# Patient Record
Sex: Female | Born: 2000 | Hispanic: Yes | Marital: Single | Smoking: Never smoker
Health system: Southern US, Community
[De-identification: ages and names within clinical notes are randomized; demographics above are authoritative.]

## PROBLEM LIST (undated history)

## (undated) DIAGNOSIS — R079 Chest pain, unspecified: Secondary | ICD-10-CM

## (undated) HISTORY — DX: Chest pain, unspecified: R07.9

---
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
## 2020-01-27 ENCOUNTER — Encounter (HOSPITAL_COMMUNITY): Payer: Self-pay

## 2020-01-27 ENCOUNTER — Emergency Department (HOSPITAL_COMMUNITY): Payer: Commercial Managed Care - PPO

## 2020-01-27 ENCOUNTER — Ambulatory Visit (HOSPITAL_COMMUNITY)
Admission: EM | Admit: 2020-01-27 | Discharge: 2020-01-27 | Disposition: A | Discharge status: Home / Self Care | Payer: Self-pay

## 2020-01-27 ENCOUNTER — Emergency Department (HOSPITAL_COMMUNITY)
Admission: EM | Admit: 2020-01-27 | Discharge: 2020-01-27 | Disposition: A | Discharge status: Home / Self Care | Payer: Commercial Managed Care - PPO | Attending: Emergency Medicine | Admitting: Emergency Medicine

## 2020-01-27 ENCOUNTER — Other Ambulatory Visit: Payer: Self-pay

## 2020-01-27 ENCOUNTER — Encounter (HOSPITAL_COMMUNITY): Payer: Self-pay | Admitting: *Deleted

## 2020-01-27 DIAGNOSIS — O2691 Pregnancy related conditions, unspecified, first trimester: Secondary | ICD-10-CM | POA: Diagnosis present

## 2020-01-27 DIAGNOSIS — R072 Precordial pain: Secondary | ICD-10-CM | POA: Insufficient documentation

## 2020-01-27 DIAGNOSIS — R000 Tachycardia, unspecified: Secondary | ICD-10-CM | POA: Insufficient documentation

## 2020-01-27 DIAGNOSIS — Z79899 Other long term (current) drug therapy: Secondary | ICD-10-CM | POA: Diagnosis not present

## 2020-01-27 DIAGNOSIS — Z3A12 12 weeks gestation of pregnancy: Secondary | ICD-10-CM | POA: Insufficient documentation

## 2020-01-27 DIAGNOSIS — R079 Chest pain, unspecified: Secondary | ICD-10-CM

## 2020-01-27 DIAGNOSIS — R0789 Other chest pain: Secondary | ICD-10-CM

## 2020-01-27 DIAGNOSIS — R0602 Shortness of breath: Secondary | ICD-10-CM | POA: Diagnosis not present

## 2020-01-27 LAB — CBC
HCT: 41.5 % (ref 36.0–46.0)
Hemoglobin: 13.7 g/dL (ref 12.0–15.0)
MCH: 30.2 pg (ref 26.0–34.0)
MCHC: 33 g/dL (ref 30.0–36.0)
MCV: 91.4 fL (ref 80.0–100.0)
Platelets: 338 10*3/uL (ref 150–400)
RBC: 4.54 MIL/uL (ref 3.87–5.11)
RDW: 13.8 % (ref 11.5–15.5)
WBC: 9.6 10*3/uL (ref 4.0–10.5)
nRBC: 0 % (ref 0.0–0.2)

## 2020-01-27 LAB — I-STAT BETA HCG BLOOD, ED (MC, WL, AP ONLY): I-stat hCG, quantitative: >2000 m[IU]/mL — ABNORMAL HIGH (ref <5)

## 2020-01-27 LAB — BASIC METABOLIC PANEL
Anion gap: 10 (ref 5–15)
BUN: 5 mg/dL — ABNORMAL LOW (ref 6–20)
CO2: 21 mmol/L — ABNORMAL LOW (ref 22–32)
Calcium: 9.2 mg/dL (ref 8.9–10.3)
Chloride: 102 mmol/L (ref 98–111)
Creatinine, Ser: 0.52 mg/dL (ref 0.44–1.00)
GFR calc Af Amer: >60 mL/min (ref >60)
GFR calc non Af Amer: >60 mL/min (ref >60)
Glucose, Bld: 95 mg/dL (ref 70–99)
Potassium: 3.8 mmol/L (ref 3.5–5.1)
Sodium: 133 mmol/L — ABNORMAL LOW (ref 135–145)

## 2020-01-27 LAB — TROPONIN I (HIGH SENSITIVITY)
Troponin I (High Sensitivity): 3 ng/L (ref <18)
Troponin I (High Sensitivity): 5 ng/L (ref <18)

## 2020-01-27 MED ORDER — SODIUM CHLORIDE 0.9 % IV BOLUS
1000.0000 mL | Freq: Once | INTRAVENOUS | Status: AC
  Administered 2020-01-27: 1000 mL via INTRAVENOUS

## 2020-01-27 MED ORDER — SODIUM CHLORIDE 0.9% FLUSH
3.0000 mL | Freq: Once | INTRAVENOUS | Status: AC
  Administered 2020-01-27: 3 mL via INTRAVENOUS

## 2020-01-27 MED ORDER — IOHEXOL 350 MG/ML SOLN
80.0000 mL | Freq: Once | INTRAVENOUS | Status: AC | PRN
  Administered 2020-01-27: 80 mL via INTRAVENOUS

## 2020-01-27 NOTE — ED Notes (Signed)
Patient is being discharged from the Urgent Care and sent to the Emergency Department via private vehicle/sister . Per Dahlia Byes, NP, patient is in need of higher level of care due to fast heart rate and pregnancy. Patient is aware and verbalizes understanding of plan of care.  Vitals:   01/27/20 1320  BP: 132/85  Pulse: (!) 130  Resp: 18  Temp: 99 F (37.2 C)  SpO2: 99%

## 2020-01-27 NOTE — ED Notes (Signed)
Patient transported to CT 

## 2020-01-27 NOTE — ED Triage Notes (Signed)
Pt got second dose of Covid shot yesterday.  Started getting chill last night.  Pt states would feel uncomfortable laying on left side.  Pt states started having chest tightness when getting dressed.  Pt is [redacted] weeks pregnant.

## 2020-01-27 NOTE — ED Notes (Signed)
Discharge instructions reviewed with pt. Pt verbalized understanding.   

## 2020-01-27 NOTE — ED Provider Notes (Signed)
[facility] Provider Note   CSN: [id number] Arrival date & time: 01/27/20  1414     History Chief Complaint  Patient presents with  . Chest Pain    Robin Mendez is a 19 y.o. female.  The history is provided by the patient.  Chest Pain Pain location:  Substernal area Pain quality: aching   Pain radiates to:  Does not radiate Pain severity:  Mild Onset quality:  Gradual Timing:  Intermittent Progression:  Waxing and waning Chronicity:  New Context: at rest   Relieved by:  Nothing Worsened by:  Nothing Associated symptoms: no abdominal pain, no back pain, no cough, no fever, no palpitations, no shortness of breath and no vomiting   Risk factors: pregnancy ([redacted] weeks pregnant)   Risk factors: no coronary artery disease, no high cholesterol and no hypertension   Risk factors comment:  Recieved second covid shot yesterday      Past Medical History:  Diagnosis Date  . Chest pain     There are no problems to display for this patient.   History reviewed. No pertinent surgical history.   OB History    Gravida  1   Para      Term      Preterm      AB      Living        SAB      TAB      Ectopic      Multiple      Live Births              No family history on file.  Social History   Tobacco Use  . Smoking status: Never Smoker  . Smokeless tobacco: Never Used  Vaping Use  . Vaping Use: Never used  Substance Use Topics  . Alcohol use: Not Currently  . Drug use: Never    Home Medications Prior to Admission medications   Medication Sig Start Date End Date Taking? Authorizing Provider  Prenatal Vit-Fe Fumarate-FA (PRENATAL MULTIVITAMIN) TABS tablet Take 1 tablet by mouth daily at 12 noon.   Yes [provider]    Allergies    Patient has no known allergies.  Review of Systems   Review of Systems  Constitutional: Negative for chills and fever.  HENT: Negative for ear pain and sore  throat.   Eyes: Negative for pain and visual disturbance.  Respiratory: Negative for cough and shortness of breath.   Cardiovascular: Positive for chest pain. Negative for palpitations.  Gastrointestinal: Negative for abdominal pain and vomiting.  Genitourinary: Negative for dysuria and hematuria.  Musculoskeletal: Negative for arthralgias and back pain.  Skin: Negative for color change and rash.  Neurological: Negative for seizures and syncope.  All other systems reviewed and are negative.   Physical Exam Updated Vital Signs  ED Triage Vitals  Enc Vitals Group     BP 01/27/20 1423 (!) 132/98     Pulse Rate 01/27/20 1423 (!) 131     Resp 01/27/20 1423 18     Temp 01/27/20 1423 98.8 F (37.1 C)     Temp Source 01/27/20 1423 Oral     SpO2 01/27/20 1423 100 %     Weight --      Height --      Head Circumference --      Peak Flow --      Pain Score 01/27/20 1431 4     Pain Loc --  Pain Edu? --      Excl. in [place]? --     Physical Exam Vitals and nursing note reviewed.  Constitutional:      General: She is not in acute distress.    Appearance: She is well-developed. She is not ill-appearing.  HENT:     Head: Normocephalic and atraumatic.  Eyes:     Extraocular Movements: Extraocular movements intact.     Conjunctiva/sclera: Conjunctivae normal.     Pupils: Pupils are equal, round, and reactive to light.  Cardiovascular:     Rate and Rhythm: Regular rhythm. Tachycardia present.     Pulses:          Radial pulses are 2+ on the right side and 2+ on the left side.     Heart sounds: Normal heart sounds. No murmur heard.   Pulmonary:     Effort: Pulmonary effort is normal. No respiratory distress.     Breath sounds: Normal breath sounds. No decreased breath sounds, wheezing, rhonchi or rales.  Abdominal:     Palpations: Abdomen is soft.     Tenderness: There is no abdominal tenderness.  Musculoskeletal:        General: Normal range of motion.     Cervical back: Normal  range of motion and neck supple.     Right lower leg: No edema.     Left lower leg: No edema.  Skin:    General: Skin is warm and dry.     Capillary Refill: Capillary refill takes less than 2 seconds.  Neurological:     General: No focal deficit present.     Mental Status: She is alert.     ED Results / Procedures / Treatments   Labs (all labs ordered are listed, but only abnormal results are displayed) Labs Reviewed  BASIC METABOLIC PANEL - Abnormal; Notable for the following components:      Result Value   Sodium 133 (*)    CO2 21 (*)    BUN 5 (*)    All other components within normal limits  I-STAT BETA HCG BLOOD, ED ([facility], [facility], [facility] ONLY) - Abnormal; Notable for the following components:   I-stat hCG, quantitative >2,000.0 (*)    All other components within normal limits  CBC  TROPONIN I (HIGH SENSITIVITY)  TROPONIN I (HIGH SENSITIVITY)    EKG EKG Interpretation  Date/Time:  Wednesday January 27 2020 14:20:29 EDT Ventricular Rate:  134 PR Interval:  114 QRS Duration: 66 QT Interval:  298 QTC Calculation: 445 R Axis:   100 Text Interpretation: Sinus tachycardia Rightward axis Confirmed by Virgina Norfolk [phone]) on 01/27/2020 7:45:59 PM   Radiology CT Angio Chest PE W and/or Wo Contrast  Result Date: 01/27/2020 CLINICAL DATA:  Shortness of breath and chest pain EXAM: CT ANGIOGRAPHY CHEST WITH CONTRAST TECHNIQUE: Multidetector CT imaging of the chest was performed using the standard protocol during bolus administration of intravenous contrast. Multiplanar CT image reconstructions and MIPs were obtained to evaluate the vascular anatomy. CONTRAST:  77mL OMNIPAQUE IOHEXOL 350 MG/ML SOLN COMPARISON:  None. FINDINGS: Cardiovascular: There is slightly suboptimal opacification of the main pulmonary artery. However no central or segmental pulmonary embolism is noted. The heart is normal in size. No pericardial effusion or thickening. No evidence right heart strain. There is normal  three-vessel brachiocephalic anatomy without proximal stenosis. The thoracic aorta is normal in appearance. Mediastinum/Nodes: No hilar, mediastinal, or axillary adenopathy. Thyroid gland, trachea, and esophagus demonstrate no significant findings. Lungs/Pleura: The lungs are clear.  No pleural effusion or pneumothorax. No airspace consolidation. Upper Abdomen: No acute abnormalities present in the visualized portions of the upper abdomen. Musculoskeletal: No chest wall abnormality. No acute or significant osseous findings. Review of the MIP images confirms the above findings. IMPRESSION: No central, segmental, or subsegmental pulmonary embolism. No other acute intrathoracic pathology to explain the patient's symptoms. Electronically Signed   By: Jonna Clark M.D.   On: 01/27/2020 22:13    Procedures Procedures (including critical care time)  Medications Ordered in ED Medications  sodium chloride flush (NS) 0.9 % injection 3 mL (3 mLs Intravenous Given 01/27/20 2029)  sodium chloride 0.9 % bolus 1,000 mL (0 mLs Intravenous Stopped 01/27/20 2223)  iohexol (OMNIPAQUE) 350 MG/ML injection 80 mL (80 mLs Intravenous Contrast Given 01/27/20 2156)    ED Course  I have reviewed the triage vital signs and the nursing notes.  Pertinent labs & imaging results that were available during my care of the patient were reviewed by me and considered in my medical decision making (see chart for details).    MDM Rules/Calculators/A&P                          Maha Fischel is a 19 year old female with no significant medical history presents to the ED with shortness of breath, chest pain, tachycardia.  Patient tachycardic to the 130s but otherwise normal vitals.  Did have second coronavirus vaccine yesterday.  Has had some body aches and chills.  She is [redacted] weeks pregnant.  Denies any abdominal pain, vaginal bleeding, discomfort.  Patient has no GU complaints.  EKG shows sinus tachycardia.  No significant anemia,  electrolyte abnormality, kidney injury.  CT scan did not show any PE or pneumonia.  Troponin negative x2.  Overall suspect symptoms secondary to Covid vaccination.  Does not appear to have any issues from a pregnancy standpoint.  We will continue to follow with OB.  Understands return precautions and discharged from ED in good condition.  At time of discharge tachycardia has improved.  IV fluids have appeared to help.  She has no chest pain no shortness of breath, no abdominal pain, no vaginal bleeding or discharge.  This chart was dictated using voice recognition software.  Despite best efforts to proofread,  errors can occur which can change the documentation meaning.    Final Clinical Impression(s) / ED Diagnoses Final diagnoses:  Atypical chest pain    Rx / DC Orders ED Discharge Orders    None       Virgina Norfolk, DO 01/27/20 2331

## 2020-01-27 NOTE — Discharge Instructions (Addendum)
Please go to the ER There is some concern for heart related issue or possible blood clot.

## 2020-01-27 NOTE — ED Triage Notes (Signed)
Pt presents today with fatigue and low grade fever x2 days after receiving covid vaccine. Pt states she has new onset chest pain as of last night, located under breast/ epigastric that is non radiating. Pt denies n/v/d, HA, cough, runny nose, sore thraot. Pt resting on treatment table, with even unlabored respirations, pink non diaphoretic skin. PT denies otc treatment, or relieving factors. Pt states her HR is usually high, unsure of baseline.

## 2020-01-27 NOTE — ED Provider Notes (Signed)
[facility]    CSN: [id number] Arrival date & time: 01/27/20  1224      History   Chief Complaint Chief Complaint  Patient presents with  . Fatigue    HPI Robin Mendez is a 19 y.o. female.   Patient is a 19 year old female who presents today with fatigue, low-grade fever, chills x2 days.  She did receive her second Covid vaccine yesterday.  New onset chest pain last night under left breast area and epigastric area that is nonradiating.  Patient tachycardic at 130.  Denies any nausea, vomiting, diarrhea, headache, cough,  rhinorrhea, sore throat.  Mild shortness of breath.  Has not taken anything for symptoms.  Denies any history of DVT, PE.  No recent traveling.  She is approximately 3 months pregnant.  ROS per HPI      Past Medical History:  Diagnosis Date  . Chest pain     There are no problems to display for this patient.   History reviewed. No pertinent surgical history.  OB History    Gravida  1   Para      Term      Preterm      AB      Living        SAB      TAB      Ectopic      Multiple      Live Births               Home Medications    Prior to Admission medications   Medication Sig Start Date End Date Taking? Authorizing Provider  Prenatal Vit-Fe Fumarate-FA (PRENATAL MULTIVITAMIN) TABS tablet Take 1 tablet by mouth daily at 12 noon.   Yes [provider]    Family History History reviewed. No pertinent family history.  Social History Social History   Tobacco Use  . Smoking status: Never Smoker  . Smokeless tobacco: Never Used  Vaping Use  . Vaping Use: Never used  Substance Use Topics  . Alcohol use: Not Currently  . Drug use: Never     Allergies   Patient has no known allergies.   Review of Systems Review of Systems   Physical Exam Triage Vital Signs ED Triage Vitals  Enc Vitals Group     BP 01/27/20 1320 132/85     Pulse Rate 01/27/20 1320 (!) 130     Resp 01/27/20 1320 18      Temp 01/27/20 1320 99 F (37.2 C)     Temp Source 01/27/20 1320 Oral     SpO2 01/27/20 1320 99 %     Weight --      Height --      Head Circumference --      Peak Flow --      Pain Score 01/27/20 1321 0     Pain Loc --      Pain Edu? --      Excl. in [place]? --    No data found.  Updated Vital Signs BP 132/85 (BP Location: Right Arm)   Pulse (!) 130   Temp 99 F (37.2 C) (Oral)   Resp 18   LMP 11/12/2019 (Approximate)   SpO2 99%   Visual Acuity Right Eye Distance:   Left Eye Distance:   Bilateral Distance:    Right Eye Near:   Left Eye Near:    Bilateral Near:     Physical Exam Vitals and nursing note reviewed.  Constitutional:  General: She is not in acute distress.    Appearance: Normal appearance. She is not ill-appearing, toxic-appearing or diaphoretic.  HENT:     Head: Normocephalic.     Nose: Nose normal.     Mouth/Throat:     Pharynx: Oropharynx is clear.  Eyes:     Conjunctiva/sclera: Conjunctivae normal.  Cardiovascular:     Rate and Rhythm: Tachycardia present.  Pulmonary:     Effort: Pulmonary effort is normal.  Musculoskeletal:        General: Normal range of motion.     Cervical back: Normal range of motion.  Skin:    General: Skin is warm and dry.     Findings: No rash.  Neurological:     Mental Status: She is alert.  Psychiatric:        Mood and Affect: Mood normal.      UC Treatments / Results  Labs (all labs ordered are listed, but only abnormal results are displayed) Labs Reviewed - No data to display  EKG   Radiology No results found.  Procedures Procedures (including critical care time)  Medications Ordered in UC Medications - No data to display  Initial Impression / Assessment and Plan / UC Course  I have reviewed the triage vital signs and the nursing notes.  Pertinent labs & imaging results that were available during my care of the patient were reviewed by me and considered in my medical decision making (see  chart for details).     Tachycardia Patient with onset tachycardia, chest pain, mild shortness of breath this started last night Patient received her second Covid vaccine yesterday and also is 3 months pregnant. Denies any specific history of DVT or PE Patient tachycardic at 130 today Some of her symptoms may be related to the covid vaccine but there is concern for her elevated HR, chest pain, SOB.  EKG here with sinus tachycardia, possible left atrial enlargement and T wave abnormality consider inferior ischemia Sending to the ER for further evaluation  Final Clinical Impressions(s) / UC Diagnoses   Final diagnoses:  Tachycardia  Chest pain, unspecified type     Discharge Instructions     Please go to the ER There is some concern for heart related issue or possible blood clot.      ED Prescriptions    None     PDMP not reviewed this encounter.   Dahlia Byes A, NP 01/27/20 1407

## 2020-01-27 NOTE — ED Notes (Signed)
Bast NP, aware of HR. See orders for EKG. Bast to assess pt.

## 2020-01-27 NOTE — ED Notes (Signed)
traci bast, np states patient is going with sister

## 2020-02-16 LAB — OB RESULTS CONSOLE GC/CHLAMYDIA
Chlamydia: NEGATIVE
Chlamydia: NEGATIVE
Chlamydia: NEGATIVE
Chlamydia: NEGATIVE
Chlamydia: NEGATIVE
Gonorrhea: NEGATIVE
Gonorrhea: NEGATIVE
Gonorrhea: NEGATIVE
Gonorrhea: NEGATIVE

## 2020-02-16 LAB — OB RESULTS CONSOLE HIV ANTIBODY (ROUTINE TESTING)
HIV: NONREACTIVE
HIV: NONREACTIVE
HIV: NONREACTIVE
HIV: NONREACTIVE

## 2020-02-16 LAB — OB RESULTS CONSOLE HEPATITIS B SURFACE ANTIGEN
Hepatitis B Surface Ag: NEGATIVE
Hepatitis B Surface Ag: NEGATIVE
Hepatitis B Surface Ag: NEGATIVE

## 2020-02-16 LAB — OB RESULTS CONSOLE RUBELLA ANTIBODY, IGM
Rubella: IMMUNE
Rubella: IMMUNE
Rubella: IMMUNE
Rubella: NON-IMMUNE/NOT IMMUNE

## 2020-02-16 LAB — HEPATITIS C ANTIBODY
HCV Ab: NEGATIVE
HCV Ab: NEGATIVE
HCV Ab: NEGATIVE
HCV Ab: NEGATIVE

## 2020-02-16 LAB — OB RESULTS CONSOLE VARICELLA ZOSTER ANTIBODY, IGG
Varicella: NON-IMMUNE/NOT IMMUNE
Varicella: NON-IMMUNE/NOT IMMUNE
Varicella: NON-IMMUNE/NOT IMMUNE

## 2020-02-16 LAB — OB RESULTS CONSOLE GBS: GBS: POSITIVE

## 2020-08-13 NOTE — L&D Delivery Note (Signed)
OB/GYN Faculty Practice Delivery Note  [name] is a 20 y.o. G1P0  at Unknown, reports 37w. She was admitted for SOL.   ROM: 0h 66m with clear fluid GBS Status: positive- inadequate treatment Maximum Maternal Temperature: 101.2*F  Labor Progress: . Patient presented from ED complete, SROM while pushing, pushed for approximately one hour without augmentation to precipitous delivery without complications  Delivery Date/Time: 08/16/2020  Delivery: Called to room and patient was complete and pushing. Head delivered ROA. No nuchal cord present. Shoulder and body delivered in usual fashion. Infant with spontaneous cry, placed on mother's abdomen, dried and stimulated. Cord clamped x 2 after 1-minute delay, and cut by FOB. Cord blood drawn. Placenta delivered spontaneously with gentle cord traction. Fundus firm with massage and Pitocin. Labia, perineum, vagina, and cervix inspected inspected with no tears.   Placenta: Spontaneous, intact, 3VC Complications: none Lacerations: none EBL: Analgesia: none  Postpartum Planning [ ]  message to sent to schedule follow-up  [ ]  vaccines UTD  Infant: Girl, to mother-baby  APGARs 9,9  3070g  , MD [facility], PGY-1 08/16/2020, 9:49 AM

## 2020-08-16 ENCOUNTER — Inpatient Hospital Stay (HOSPITAL_COMMUNITY)
Admission: EM | Admit: 2020-08-16 | Discharge: 2020-08-18 | DRG: 806 | Disposition: A | Discharge status: Home / Self Care | Payer: Medicaid Other | Attending: Family Medicine | Admitting: Family Medicine

## 2020-08-16 ENCOUNTER — Encounter (HOSPITAL_COMMUNITY): Payer: Self-pay | Admitting: *Deleted

## 2020-08-16 DIAGNOSIS — O4202 Full-term premature rupture of membranes, onset of labor within 24 hours of rupture: Secondary | ICD-10-CM | POA: Diagnosis not present

## 2020-08-16 DIAGNOSIS — Z20822 Contact with and (suspected) exposure to covid-19: Secondary | ICD-10-CM | POA: Diagnosis present

## 2020-08-16 DIAGNOSIS — Z3A39 39 weeks gestation of pregnancy: Secondary | ICD-10-CM | POA: Diagnosis not present

## 2020-08-16 DIAGNOSIS — O9902 Anemia complicating childbirth: Secondary | ICD-10-CM | POA: Diagnosis present

## 2020-08-16 DIAGNOSIS — O99824 Streptococcus B carrier state complicating childbirth: Secondary | ICD-10-CM | POA: Diagnosis present

## 2020-08-16 DIAGNOSIS — Z3490 Encounter for supervision of normal pregnancy, unspecified, unspecified trimester: Secondary | ICD-10-CM | POA: Diagnosis present

## 2020-08-16 DIAGNOSIS — O9903 Anemia complicating the puerperium: Secondary | ICD-10-CM | POA: Diagnosis not present

## 2020-08-16 DIAGNOSIS — O26893 Other specified pregnancy related conditions, third trimester: Secondary | ICD-10-CM | POA: Diagnosis present

## 2020-08-16 DIAGNOSIS — D649 Anemia, unspecified: Secondary | ICD-10-CM | POA: Diagnosis not present

## 2020-08-16 DIAGNOSIS — Z3A37 37 weeks gestation of pregnancy: Secondary | ICD-10-CM

## 2020-08-16 HISTORY — DX: Encounter for supervision of normal pregnancy, unspecified, unspecified trimester: Z34.90

## 2020-08-16 LAB — CBC
HCT: 39.5 % (ref 36.0–46.0)
Hemoglobin: 12.3 g/dL (ref 12.0–15.0)
MCH: 28.1 pg (ref 26.0–34.0)
MCHC: 31.1 g/dL (ref 30.0–36.0)
MCV: 90.4 fL (ref 80.0–100.0)
Platelets: 386 10*3/uL (ref 150–400)
RBC: 4.37 MIL/uL (ref 3.87–5.11)
RDW: 14.9 % (ref 11.5–15.5)
WBC: 18.7 10*3/uL — ABNORMAL HIGH (ref 4.0–10.5)
nRBC: 0 % (ref 0.0–0.2)

## 2020-08-16 LAB — TYPE AND SCREEN
ABO/RH(D): O POS
Antibody Screen: NEGATIVE

## 2020-08-16 LAB — RESP PANEL BY RT-PCR (FLU A&B, COVID) ARPGX2
Influenza A by PCR: NEGATIVE
Influenza B by PCR: NEGATIVE
SARS Coronavirus 2 by RT PCR: NEGATIVE

## 2020-08-16 MED ORDER — DIBUCAINE (PERIANAL) 1 % EX OINT: 1.0000 "application " | TOPICAL_OINTMENT | CUTANEOUS | Status: DC | PRN

## 2020-08-16 MED ORDER — SODIUM CHLORIDE 0.9 % IV SOLN
2.0000 g | Freq: Once | INTRAVENOUS | Status: AC
  Administered 2020-08-16: 2 g via INTRAVENOUS
  Filled 2020-08-16: qty 2000

## 2020-08-16 MED ORDER — ACETAMINOPHEN 325 MG PO TABS: 650.0000 mg | ORAL_TABLET | ORAL | Status: DC | PRN

## 2020-08-16 MED ORDER — ONDANSETRON HCL 4 MG PO TABS: 4.0000 mg | ORAL_TABLET | ORAL | Status: DC | PRN

## 2020-08-16 MED ORDER — BENZOCAINE-MENTHOL 20-0.5 % EX AERO: 1.0000 "application " | INHALATION_SPRAY | CUTANEOUS | Status: DC | PRN

## 2020-08-16 MED ORDER — SENNOSIDES-DOCUSATE SODIUM 8.6-50 MG PO TABS
2.0000 | ORAL_TABLET | ORAL | Status: DC
  Administered 2020-08-16 – 2020-08-17 (×2): 2 via ORAL
  Filled 2020-08-16 (×2): qty 2

## 2020-08-16 MED ORDER — ONDANSETRON HCL 4 MG/2ML IJ SOLN: 4.0000 mg | Freq: Four times a day (QID) | INTRAMUSCULAR | Status: DC | PRN

## 2020-08-16 MED ORDER — WITCH HAZEL-GLYCERIN EX PADS: 1.0000 "application " | MEDICATED_PAD | CUTANEOUS | Status: DC | PRN

## 2020-08-16 MED ORDER — OXYCODONE-ACETAMINOPHEN 5-325 MG PO TABS: 2.0000 | ORAL_TABLET | ORAL | Status: DC | PRN

## 2020-08-16 MED ORDER — OXYTOCIN-SODIUM CHLORIDE 30-0.9 UT/500ML-% IV SOLN
2.5000 [IU]/h | INTRAVENOUS | Status: DC
  Administered 2020-08-16: 2.5 [IU]/h via INTRAVENOUS
  Filled 2020-08-16: qty 500

## 2020-08-16 MED ORDER — COCONUT OIL OIL: 1.0000 "application " | TOPICAL_OIL | Status: DC | PRN

## 2020-08-16 MED ORDER — DIPHENHYDRAMINE HCL 25 MG PO CAPS
25.0000 mg | ORAL_CAPSULE | Freq: Four times a day (QID) | ORAL | Status: DC | PRN
Start: 2020-08-16 — End: 2020-08-18

## 2020-08-16 MED ORDER — IBUPROFEN 600 MG PO TABS
600.0000 mg | ORAL_TABLET | Freq: Four times a day (QID) | ORAL | Status: DC
  Administered 2020-08-16 – 2020-08-17 (×7): 600 mg via ORAL
  Filled 2020-08-16 (×8): qty 1

## 2020-08-16 MED ORDER — TETANUS-DIPHTH-ACELL PERTUSSIS 5-2.5-18.5 LF-MCG/0.5 IM SUSY: 0.5000 mL | PREFILLED_SYRINGE | Freq: Once | INTRAMUSCULAR | Status: DC

## 2020-08-16 MED ORDER — SOD CITRATE-CITRIC ACID 500-334 MG/5ML PO SOLN: 30.0000 mL | ORAL | Status: DC | PRN

## 2020-08-16 MED ORDER — LACTATED RINGERS IV SOLN: INTRAVENOUS | Status: DC

## 2020-08-16 MED ORDER — PRENATAL MULTIVITAMIN CH
1.0000 | ORAL_TABLET | Freq: Every day | ORAL | Status: DC
  Administered 2020-08-16 – 2020-08-17 (×2): 1 via ORAL
  Filled 2020-08-16 (×2): qty 1

## 2020-08-16 MED ORDER — OXYTOCIN BOLUS FROM INFUSION
333.0000 mL | Freq: Once | INTRAVENOUS | Status: AC
  Administered 2020-08-16: 333 mL via INTRAVENOUS

## 2020-08-16 MED ORDER — LIDOCAINE HCL (PF) 1 % IJ SOLN: 30.0000 mL | INTRAMUSCULAR | Status: DC | PRN

## 2020-08-16 MED ORDER — ZOLPIDEM TARTRATE 5 MG PO TABS: 5.0000 mg | ORAL_TABLET | Freq: Every evening | ORAL | Status: DC | PRN

## 2020-08-16 MED ORDER — GENTAMICIN SULFATE 40 MG/ML IJ SOLN
400.0000 mg | INTRAVENOUS | Status: AC
  Administered 2020-08-16: 400 mg via INTRAVENOUS
  Filled 2020-08-16: qty 10

## 2020-08-16 MED ORDER — SIMETHICONE 80 MG PO CHEW: 80.0000 mg | CHEWABLE_TABLET | ORAL | Status: DC | PRN

## 2020-08-16 MED ORDER — OXYCODONE-ACETAMINOPHEN 5-325 MG PO TABS: 1.0000 | ORAL_TABLET | ORAL | Status: DC | PRN

## 2020-08-16 MED ORDER — LACTATED RINGERS IV SOLN: 500.0000 mL | INTRAVENOUS | Status: DC | PRN

## 2020-08-16 MED ORDER — ONDANSETRON HCL 4 MG/2ML IJ SOLN: 4.0000 mg | INTRAMUSCULAR | Status: DC | PRN

## 2020-08-16 NOTE — ED Triage Notes (Signed)
Emergency Medicine Provider OB Triage Evaluation Note  Robin Mendez is a 20 y.o. female, G1P0, at Unknown gestation who presents to the emergency department with complaints of abdominal and pelvic pain that began this morning. She is at [redacted] weeks gestation.  She feels like she needs to push. Believes ?her water broke last night.  Pregnancy complicated by group B strep.  Review of  Systems  Positive: Abdominal and pelvic pain Negative: Vaginal bleeding  Physical Exam  BP 132/78   Pulse (!) 132   Temp (!) 101.2 F (38.4 C) (Oral)   Resp (!) 24   LMP 11/12/2019 (Approximate)   SpO2 93%  General: Awake, no distress  HEENT: Atraumatic  Resp: Normal effort  Cardiac: Normal rate Abd: Gravid uterus MSK: Moves all extremities without difficulty Neuro: Speech clear  Medical Decision Making  Pt evaluated for pregnancy concern and is stable for transfer to MAU. Pt is in agreement with plan for transfer.  7:59 AM triage nurse and I spoke to MAU.  There was some concern that we would need to call rapid OB prior to transport.  However due to acuity of condition and need for specialty service for possible delivery, she was transferred to MAU for further evaluation.  I was unaware that she was febrile upon arrival here.  Clinical Impression  No diagnosis found.     Dietrich Pates, PA-C 08/16/20 [phone]

## 2020-08-16 NOTE — H&P (Signed)
Robin Mendez is a 20 y.o. female presenting for SOL. Patient started having contractions last night, progressed and now about every 3 minutes. Went to ED and transferred to MAU. Found to be complete, but membranes intact - patient moved to labor floor. Uncomplicated pregnancy. Went to [facility] for care.  Noted to have fever in ED - no cough, COVID or sick contacts.  OB History    Gravida  1   Para      Term      Preterm      AB      Living        SAB      IAB      Ectopic      Multiple      Live Births             Past Medical History:  Diagnosis Date  . Chest pain    No past surgical history on file. Family History: family history is not on file. Social History:  reports that she has never smoked. She has never used smokeless tobacco. She reports previous alcohol use. She reports that she does not use drugs.     Maternal Diabetes: No Genetic Screening: Normal Maternal Ultrasounds/Referrals: Normal Fetal Ultrasounds or other Referrals:  None Maternal Substance Abuse:  No Significant Maternal Medications:  None Significant Maternal Lab Results:  Group B Strep positive Other Comments:  None  Review of Systems  All other systems reviewed and are negative.  History Dilation: 10 Effacement (%): 100 Exam by:: Dr. Adrian Blackwater Blood pressure 132/78, pulse (!) 132, temperature 99.9 F (37.7 C), temperature source Axillary, resp. rate (!) 24, weight 86.2 kg, last menstrual period 11/12/2019, SpO2 93 %. Maternal Exam:  Uterine Assessment: Contraction strength is moderate.  Contraction duration is 60 seconds. Contraction frequency is regular.   Abdomen: Patient reports no abdominal tenderness. Surgical scars: low transverse.   Fundal height is term.   Fetal presentation: vertex  Introitus: Normal vulva. Normal vagina.  Ferning test: not done.  Nitrazine test: not done. Amniotic fluid character: not assessed.     Fetal Exam Fetal Monitor Review: Baseline rate:  150s.  Variability: moderate (6-25 bpm).   Pattern: no accelerations and no decelerations.    Fetal State Assessment: Category I - tracings are normal.     Physical Exam Vitals reviewed. Exam conducted with a chaperone present.  Constitutional:      Appearance: Normal appearance.  Cardiovascular:     Rate and Rhythm: Regular rhythm. Tachycardia present.     Pulses: Normal pulses.  Pulmonary:     Effort: Pulmonary effort is normal.  Abdominal:     General: Abdomen is flat.  Genitourinary:    General: Normal vulva.  Skin:    General: Skin is warm and dry.     Capillary Refill: Capillary refill takes less than 2 seconds.  Neurological:     General: No focal deficit present.     Mental Status: She is alert.  Psychiatric:        Mood and Affect: Mood normal.        Behavior: Behavior normal.        Thought Content: Thought content normal.        Judgment: Judgment normal.     Prenatal labs: ABO, Rh:   Antibody:   Rubella:   RPR:    HBsAg:    HIV:    GBS:     Assessment/Plan: Ampicillin and gentamycin for intrapartum fever COVID swab  CBC Expect vaginal delivery Breast feeding    Rhona Raider [facility] 08/16/2020, 8:44 AM

## 2020-08-16 NOTE — Lactation Note (Signed)
This note was copied from a baby's chart. Lactation Consultation Note  Patient Name: Girl Jaymes Revels KGYJE'H Date: 08/16/2020 Reason for consult: L&D Initial assessment Age:20 years   P1, infant lying STS with infant between mothers chest. Mother reports that she was planning to breastfeed and would like to have assistance with latching infant.  Infant placed in cradle hold . Assist mother with hand expressing. Mother observed that she has colostrum.  Infant attempt several times to latch and unable to sustain a latch.  Infant placed in football hold . Infant latched on for 15 mins . Observed good rhythmic suckling. Mother happy about infants feed.  Teaching about cues and informed mother that she would be seen again when she gets to her room later and more teaching would be done.     Maternal Data    Feeding Feeding Type: Breast Fed  LATCH Score Latch: Grasps breast easily, tongue down, lips flanged, rhythmical sucking.  Audible Swallowing: Spontaneous and intermittent  Type of Nipple: Everted at rest and after stimulation  Comfort (Breast/Nipple): Soft / non-tender  Hold (Positioning): Assistance needed to correctly position infant at breast and maintain latch.  LATCH Score: 9  Interventions Interventions: Assisted with latch;Skin to skin;Hand express;Adjust position  Lactation Tools Discussed/Used     Consult Status Consult Status: Follow-up Date: 08/16/20 Follow-up type: In-patient    Stevan Born [facility] 08/16/2020, 12:27 PM

## 2020-08-16 NOTE — Lactation Note (Signed)
This note was copied from a baby's chart. Lactation Consultation Note  Patient Name: Robin Mendez APOLI'D Date: 08/16/2020   Age:21 hours  Mom seen in labor and delivery.  However no consult to be seen on floor.  Contacted RN, Sammuel Hines who reports she will put a lactation consult in  [facility] 08/16/2020, 4:34 PM

## 2020-08-16 NOTE — MAU Note (Addendum)
Pt arrived to unit with a complaint of contractions and an urge to push. External fetal monitor applied at 0805, FHR 158. Pt reports spotting, no leaking of fluid, and +FM. At 0808, cervical exam performed by Dr. Adrian Blackwater, pt is complete with a bulging bag of water. Pt transported to L&D with MD and nurses present.

## 2020-08-16 NOTE — Progress Notes (Signed)
ANTIBIOTIC CONSULT NOTE - INITIAL  Pharmacy Consult for Gentamicin Indication: Chorioamnionitis   No Known Allergies  Patient Measurements: Height: 5\' 4"  (162.6 cm) Weight: 86.2 kg (190 lb 0.6 oz) IBW/kg (Calculated) : 54.7 kg Adjusted Body Weight: 67.3 kg  Vital Signs: Temp: 99.9 F (37.7 C) (01/04 0823) Temp Source: Axillary (01/04 0823) BP: 145/88 (01/04 0900) Pulse Rate: 113 (01/04 0900)    Microbiology: No results found for this or any previous visit (from the past 720 hour(s)).  Medications:  Ampicillin 2 grams IV x1  Assessment: 20 y.o. female G1P0 in labor - developed fever   Plan:  Gentamicin 5 mg/kg IV x 1  Check Scr with next labs if gentamicin continued.   26 08/16/2020,9:15 AM

## 2020-08-17 DIAGNOSIS — O9903 Anemia complicating the puerperium: Secondary | ICD-10-CM

## 2020-08-17 DIAGNOSIS — D649 Anemia, unspecified: Secondary | ICD-10-CM

## 2020-08-17 LAB — CBC
HCT: 29.9 % — ABNORMAL LOW (ref 36.0–46.0)
Hemoglobin: 9.6 g/dL — ABNORMAL LOW (ref 12.0–15.0)
MCH: 28.3 pg (ref 26.0–34.0)
MCHC: 32.1 g/dL (ref 30.0–36.0)
MCV: 88.2 fL (ref 80.0–100.0)
Platelets: 354 10*3/uL (ref 150–400)
RBC: 3.39 MIL/uL — ABNORMAL LOW (ref 3.87–5.11)
RDW: 14.8 % (ref 11.5–15.5)
WBC: 27.1 10*3/uL — ABNORMAL HIGH (ref 4.0–10.5)
nRBC: 0 % (ref 0.0–0.2)

## 2020-08-17 LAB — RPR: RPR Ser Ql: NONREACTIVE

## 2020-08-17 MED ORDER — FERROUS SULFATE 325 (65 FE) MG PO TABS: 325.0000 mg | ORAL_TABLET | ORAL | Status: DC

## 2020-08-17 NOTE — Progress Notes (Addendum)
Post Partum Day 1  Subjective: Ms. Nevins reports no pain, however she has mild discomfort/heaviness in her pelvic region. She reports being up ad lib and denies any leg pain or swelling. She has been voiding urine regularly but has been unable to have a BM or flatus since delivery. Moderate bleeding; no clots in the past 22 hours. She is tolerating food PO without nausea or vomiting and denies abdominal pain. She reports that she is not interested in beginning any postpartum contraceptive.   Objective: Blood pressure 109/69, pulse 93, temperature 98.6 F (37 C), temperature source Oral, resp. rate 18, height 5\' 4"  (1.626 m), weight 86.2 kg, last menstrual period 11/12/2019, SpO2 95 %, unknown if currently breastfeeding.  Physical Exam:  General: alert and no distress  Heart: regular rate and rhythm  Lungs: clear to ausculation bilaterally  GI: nontender to palpation, normoactive bowel sounds  Lochia: appropriate  Uterine Fundus: firm DVT Evaluation: No evidence of DVT seen on physical exam. Extremities: pedal pulse 2+ bilaterally, no significant calf/ankle edema   Recent Labs    08/16/20 0937  HGB 12.3  HCT 39.5    Assessment/Plan: Status post spontaneous vaginal delivery. PPD#1 Continue management for lactation consult, monitor vaginal bleeding, educate on contraception, and evaluate for discharge.    LOS: 1 day   10/14/20 08/17/2020, 7:54 AM   Attestation of Supervision of Student:  I confirm that I have verified the information documented in the medical student's note and that I have also personally reperformed the history, physical exam and all medical decision making activities.  I have verified that all services and findings are accurately documented in this student's note; and I agree with management and plan as outlined in the documentation. I have also made any necessary editorial changes.  10/15/2020, MD Center for [facility], [facility] 08/17/2020 9:43 AM

## 2020-08-17 NOTE — Discharge Summary (Signed)
Postpartum Discharge Summary    Patient Name: Robin Mendez DOB: 16-May-2001 MRN: [id number]  Date of admission: 08/16/2020 Delivery date:08/16/2020  Delivering provider: Truett Mainland  Date of discharge: 08/18/2020  Admitting diagnosis: Encounter for induction of labor [Z34.90] Intrauterine pregnancy: [redacted]w[redacted]d    Secondary diagnosis:  Principal Problem:   Vaginal delivery Active Problems:   Encounter for induction of labor  Additional problems: as noted above   Discharge diagnosis: Vaginal delivery                                   Post partum procedures:none Augmentation: none Complications: None  Hospital course: Onset of Labor With Vaginal Delivery      20y.o. yo G1P1001 at 365w5das admitted in Active Labor on 08/16/2020. Patient had an uncomplicated labor course as follows:  Membrane Rupture Time/Date: 8:27 AM ,08/16/2020   Delivery Method:Vaginal, Spontaneous  Episiotomy: None  Lacerations:  None  Patient had an uncomplicated postpartum course.  She is ambulating, tolerating a regular diet, passing flatus, and urinating well. Patient is discharged home in stable condition on 08/18/20.  Newborn Data: Birth date:08/16/2020  Birth time:9:16 AM  Gender:Female  Living status:Living  Apgars:9 ,9  Weight:3070 g   Magnesium Sulfate received: No BMZ received: No Rhophylac:N/A MMR:N/A T-DaP: offered prior to discharge Flu: offered prior to discharge Transfusion:No  Physical exam  Vitals:   08/17/20 0605 08/17/20 1239 08/17/20 2323 08/18/20 0540  BP: 109/69 114/78 116/77 120/77  Pulse: 93 93 88 79  Resp: _0 Temp:  98 F (36.7 C) 97.9 F (36.6 C) 98 F (36.7 C)  TempSrc:  Oral Oral Oral  SpO2:   100% 100%  Weight:      Height:       General: alert, cooperative and no distress Lochia: appropriate Uterine Fundus: firm Incision: N/A DVT Evaluation: No evidence of DVT seen on physical exam. No cords or calf tenderness. No significant calf/ankle  edema. Labs: Lab Results  Component Value Date   WBC 27.1 (H) 08/17/2020   HGB 9.6 (L) 08/17/2020   HCT 29.9 (L) 08/17/2020   MCV 88.2 08/17/2020   PLT 354 08/17/2020   CMP Latest Ref Rng & Units 01/27/2020  Glucose 70 - 99 mg/dL 95  BUN 6 - 20 mg/dL 5(L)  Creatinine 0.44 - 1.00 mg/dL 0.52  Sodium 135 - 145 mmol/L 133(L)  Potassium 3.5 - 5.1 mmol/L 3.8  Chloride 98 - 111 mmol/L 102  CO2 22 - 32 mmol/L 21(L)  Calcium 8.9 - 10.3 mg/dL 9.2   Edinburgh Score: Edinburgh Postnatal Depression Scale Screening Tool 08/17/2020  I have been able to laugh and see the funny side of things. 0  I have looked forward with enjoyment to things. 0  I have blamed myself unnecessarily when things went wrong. 1  I have been anxious or worried for no good reason. 0  I have felt scared or panicky for no good reason. 0  Things have been getting on top of me. 1  I have been so unhappy that I have had difficulty sleeping. 0  I have felt sad or miserable. 0  I have been so unhappy that I have been crying. 0  The thought of harming myself has occurred to me. 0  Edinburgh Postnatal Depression Scale Total 2     After visit meds:  Allergies as of 08/18/2020   No  Known Allergies     Medication List    TAKE these medications   acetaminophen 325 MG tablet Commonly known as: Tylenol Take 2 tablets (650 mg total) by mouth every 6 (six) hours as needed for mild pain, moderate pain, fever or headache.   coconut oil Oil Apply 1 application topically as needed (nipple pain).   ferrous sulfate 325 (65 FE) MG tablet Take 1 tablet (325 mg total) by mouth every other day.   ibuprofen 600 MG tablet Commonly known as: ADVIL Take 1 tablet (600 mg total) by mouth every 8 (eight) hours as needed for moderate pain or cramping.   prenatal multivitamin Tabs tablet Take 1 tablet by mouth daily at 12 noon.       Discharge home in stable condition Infant Feeding: Breast Infant Disposition:home with  mother Discharge instruction: per After Visit Summary and Postpartum booklet. Activity: Advance as tolerated. Pelvic rest for 6 weeks.  Diet: routine diet Future Appointments:No future appointments. Follow up Visit: Pt instructed to call [facility] to schedule postpartum appointment.  Please schedule this patient for a In person postpartum visit in 6 weeks with the following provider: Any provider. Additional Postpartum F/U:none  Low risk pregnancy complicated by: anemia (discharged on po iron) Delivery mode:  Vaginal, Spontaneous  Anticipated Birth Control:  Vanessa Viburnum, Gildardo Cranker, MD OB Fellow, Faculty Practice 08/18/2020 7:11 AM

## 2020-08-17 NOTE — Discharge Instructions (Signed)

## 2020-08-17 NOTE — Lactation Note (Signed)
This note was copied from a baby's chart. Lactation Consultation Note  Patient Name: Robin Mendez [id number] Date: 08/17/2020 Reason for consult: Follow-up assessment Age:21 hours   P1 mother whose infant is now 60 hours old.  This is a term baby at 39+5 weeks.  Mother had just finished breast feeding when I arrived; father attempting to burp baby.  She was asleep on his shoulder.  Mother had asked the RN for some formula "just in case" she needed it.  Reiterated the importance of putting baby to the breast any time she shows feeding cues.  Discussed the benefits of breast milk and mother verbalized understanding.  She really wants to exclusively breast feed but she wants to be sure her baby is "getting enough."  Baby has adequate voids/stools and is content after this last 30 minute feeding.  Suggested mother call for latch assistance if needed.  She will continue to feed 8-12 times/24 hours or sooner if baby shows feeding cues.  She is familiar with hand expression.  Mother does not have a DEBP for home use.  She is a "stay at home" mother and plans to apply for [facility].  I suggested she call the [facility] office today to make an appointment to apply.  Mother verbalized understanding.    Father inquired about the bilirubin level that was drawn this morning.  I provided the results and, again, reminded mother to continue to breast feed often.  Family with no further questions/concerns at this time.  RN updated.   Maternal Data    Feeding Feeding Type: Breast Fed  LATCH Score Latch: Grasps breast easily, tongue down, lips flanged, rhythmical sucking. (per mother)  Audible Swallowing: A few with stimulation  Type of Nipple: Everted at rest and after stimulation  Comfort (Breast/Nipple): Soft / non-tender  Hold (Positioning): No assistance needed to correctly position infant at breast.  LATCH Score: 9  Interventions    Lactation Tools Discussed/Used     Consult Status Consult  Status: Follow-up Date: 08/18/20 Follow-up type: In-patient    Isatu Macinnes R Rita Prom 08/17/2020, 1:17 PM

## 2020-08-18 LAB — SURGICAL PATHOLOGY

## 2020-08-18 MED ORDER — ACETAMINOPHEN 325 MG PO TABS: 650.0000 mg | ORAL_TABLET | Freq: Four times a day (QID) | ORAL | Status: DC | PRN

## 2020-08-18 MED ORDER — COCONUT OIL OIL: 1.0000 "application " | TOPICAL_OIL | 0 refills | Status: DC | PRN

## 2020-08-18 MED ORDER — IBUPROFEN 600 MG PO TABS: 600.0000 mg | ORAL_TABLET | Freq: Three times a day (TID) | ORAL | 0 refills | Status: DC | PRN

## 2020-08-18 MED ORDER — FERROUS SULFATE 325 (65 FE) MG PO TABS: 325.0000 mg | ORAL_TABLET | ORAL | 1 refills | Status: DC

## 2020-08-18 NOTE — Lactation Note (Signed)
This note was copied from a baby's chart. Lactation Consultation Note  Patient Name: Robin Mendez [id number] Date: 08/18/2020 Reason for consult: Mother's request;Primapara Age:21 hours  Mom requested for me to return. I assisted Mom in latching baby. Mom's breasts are filling. Swallows were immediately noted (& verified by cervical auscultation). Mom was comfortable with latch. Frequent swallows were audible to naked ear, which Mom was able to identify.  Infant latched with relative ease. I did assist mother with hand placement, breast compression, maternal alignment, and how, on occasion, she may need to lower infant's chin to widen gape. Specifics of an asymmetric latch shown via [employer]. Infant finished feeding content.   Mom's nipple shape not distorted when infant released her latches. I taught Mom how to palpate her breasts before and after a feeding to determine milk transfer/softening of breast. Both of Mom's breasts felt softer after the feedings, with Mom saying, ""I feel the difference a lot" after infant had fed on one breast for 20 min.  At rest, Mom's nipple diameter shows that she could use a size 21 flange, which I provided. Mom also knows she has a size 24 flange in case she needs more area around nipple. I showed Mom how to disassemble & assemble hand pump. Hand pump use is prn. Mom knows how to reach [place] if she has any post-discharge questions.    Mom was thankful for consult.   Lurline Hare [facility] 08/18/2020, 9:26 AM

## 2020-08-18 NOTE — Lactation Note (Signed)
This note was copied from a baby's chart. Lactation Consultation Note  Patient Name: Robin Mendez HERDE'Y Date: 08/18/2020   Age:21 hours  Mom holding infant, who was sleeping when I went into room. Mom will be calling me to return to assess next latch.  Mom only has a [place] pump at home. I provided Mom with a hand pump. I'll discern proper flange size at next visit.   Mom says her breasts feel a little bit heavier. Nipples intact.  Regular flow nipple noted in room; Dad reports gulping when infant bottle-feeds. I took a Similac slow-flow nipple into room, if later needed.    Lurline Hare [facility] 08/18/2020, 8:54 AM

## 2020-08-20 ENCOUNTER — Ambulatory Visit: Payer: Self-pay

## 2020-08-20 NOTE — Lactation Note (Signed)
This note was copied from a baby's chart. Lactation Consultation Note  Patient Name: Robin Mendez Today's Date: 08/20/2020 Reason for consult: Follow-up assessment;Mother's request;Hyperbilirubinemia;Other (Comment) (Pediatric admission) Age:21 days  Follow up visit to 81 days old infant with hyperbilirrubinemia. Parents are present at time of visit. Father of infant is getting ready to bottle-feed formula. Mother requests assistance with personal pump. Mother explains she has not used hospital DEBP since yesterday. Talked to mother about the importance of pumping to avoid engorgement and to establish a good milk supply. Reinforced the importance of pumping every time infant gets supplementation.   Mother states her personal pump does not seem to be working. Personal breast pump power adaptor is not working. Discussed taking back to purchasing store and/or Administrator, [employer] service.   Mother wants assistance with pumping. Reviewed how to use hospital grade DEBP with maintenance setting. Mother states she pumped ~45 mL of EBM. Showed initial drops, then spraying (letdown), followed by drops again. Pumped for two more minutes when noticing drops after letdown. Discussed that pumping session time will always vary and it will give her stimulation similar to infant feeding from breast. Collected ~104 mL of EBM in 25 minutes. Mother verbalizes in agreement. Reviewed milk storage and care of pump parts. Provided "understanding breastfeeding" booklet for more information.   Encouraged to request [facility] for any needs, support or questions. Praised mother for her milk supply, effort and dedication.    Feeding Feeding Type: Bottle Fed - Formula Nipple Type: Nfant Slow Flow (purple)  Interventions Interventions: Breast feeding basics reviewed;Breast massage;Expressed milk;DEBP  Lactation Tools Discussed/Used Tools: Pump;Flanges;Bottle Flange Size: 24 Breast pump type: Double-Electric  Breast Pump Pump Education: Setup, frequency, and cleaning;Milk Storage   Consult Status Consult Status: Follow-up Date: 08/21/20 Follow-up type: In-patient    Jess Toney A Higuera Ancidey 08/20/2020, 12:36 PM

## 2020-08-20 NOTE — Lactation Note (Addendum)
This note was copied from a baby's chart. Lactation Consultation Note Baby 68 hrs old when saw mom. Baby on DPT.  Mom hadn't pump and breast are engorged. Mom's pump battery dead. RN charging mom's pump. RN brought hospital DEBP to rm. LC brought pump kit. Mom stated she BF baby for 17 minutes.  Breast firm w/knots. Rt. Breast has more knots than Lt. Breast. Used #24 flange. Pumped breast immediately transitional milk flowed well into bottles.  Mom shown how to use DEBP & how to disassemble, clean, & reassemble parts. Mom knows to pump q 2-3h for 15-20 min. As needed for engorgement and supplementing.  LC massaged breast while mom pumped. Mom collected almost 47 ml. Praised mom. Laid mom back. Applied Ice to breast over cloth. Mom very sleepy. Instructed mom to ice for 20 minutes. Encouraged mom to give BM after BF for supplement d/t hyperbilirubinemia.  Pump according needs of breast to manage engorgement. Milk storage reviewed. Reported to RN.  Patient Name: Robin Mendez Today's Date: 08/20/2020 Reason for consult: Initial assessment;Primapara;Hyperbilirubinemia Age:21 days  Maternal Data Has patient been taught Hand Expression?: Yes Does the patient have breastfeeding experience prior to this delivery?: No  Feeding Feeding Type: Formula (Sim Advance) Nipple Type: Nfant Slow Flow (purple) (normal slow flow nipples out of stock)  LATCH Score       Type of Nipple: Everted at rest and after stimulation  Comfort (Breast/Nipple): Engorged, cracked, bleeding, large blisters, severe discomfort        Interventions Interventions: DEBP;Ice;Breast massage;Breast compression  Lactation Tools Discussed/Used Tools: Pump Breast pump type: Double-Electric Breast Pump Pump Education: Setup, frequency, and cleaning;Milk Storage Initiated by:: Allayne Stack RN IBCLC Date initiated:: 08/20/20   Consult Status Consult Status: Follow-up Date: 08/20/20 Follow-up type:  In-patient    Theodoro Kalata 08/20/2020, 3:49 AM

## 2020-08-22 NOTE — MAU Note (Signed)
Evaluated in MAU and brought to L&D. See H&P note.

## 2021-05-01 IMAGING — CT CT ANGIO CHEST
2 of 6 series · 18 of 46 positions shown · IV contrast (omnipaque)
Comparison: None.

CLINICAL DATA: Shortness of breath and chest pain

EXAM:
CT ANGIOGRAPHY CHEST WITH CONTRAST
TECHNIQUE: Multidetector CT imaging of the chest was performed using the
standard protocol during bolus administration of intravenous
contrast. Multiplanar CT image reconstructions and MIPs were
obtained to evaluate the vascular anatomy.
CONTRAST:  80mL OMNIPAQUE IOHEXOL 350 MG/ML SOLN

[Series 6: thins · axial · 0.70mm/px · z∈[+1127,+1349]mm · 15 of 244 slices shown]
[im 11/244  lung]
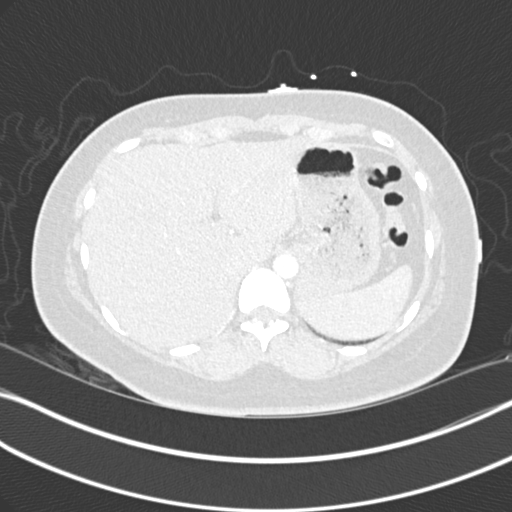
[im 32/244  soft-tissue]
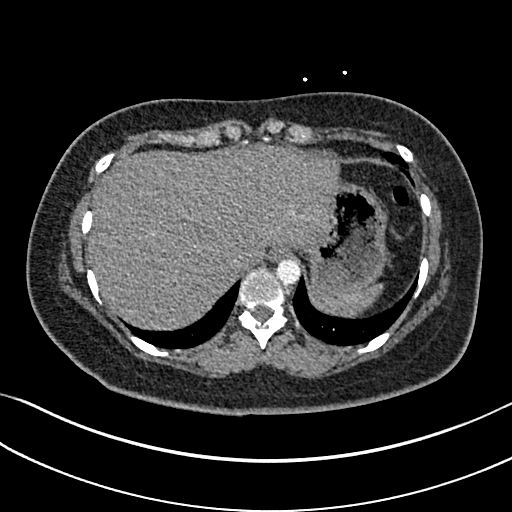
[im 43/244  lung]
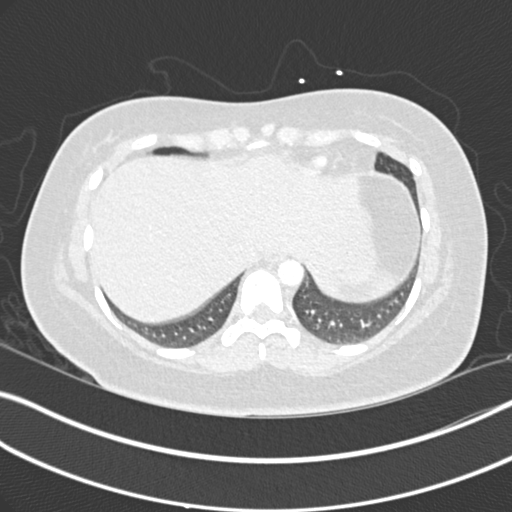
[im 64/244  soft-tissue]
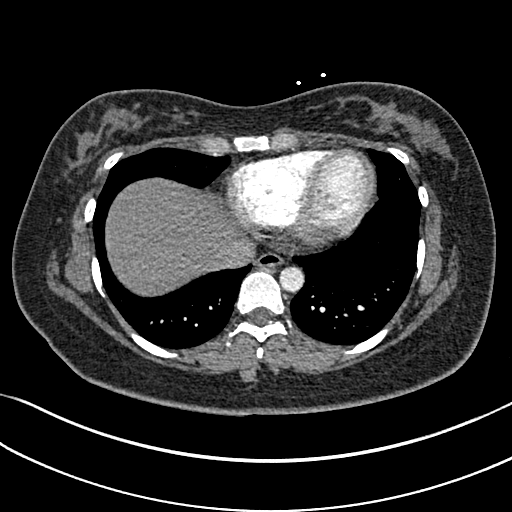
[im 74/244  lung]
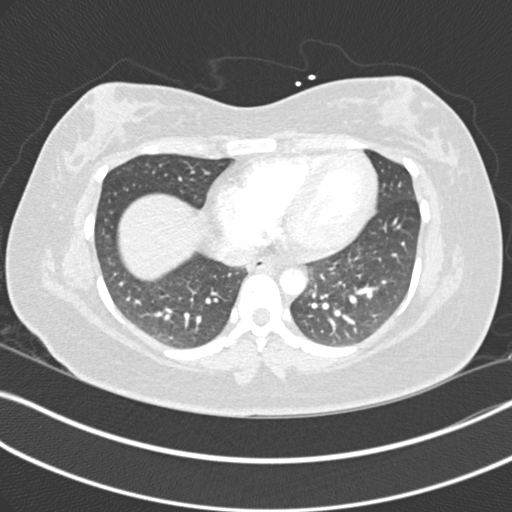
[im 96/244  soft-tissue]
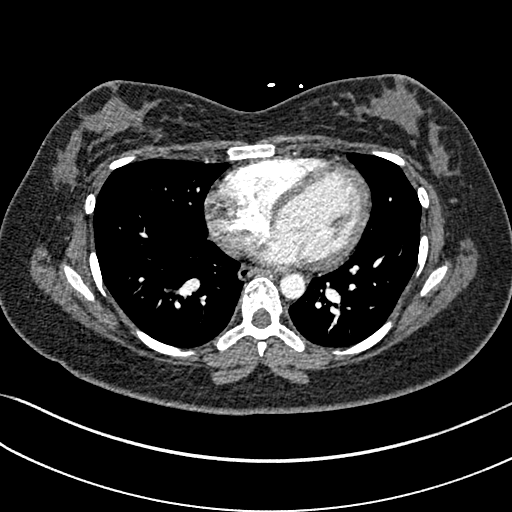
[im 106/244  lung]
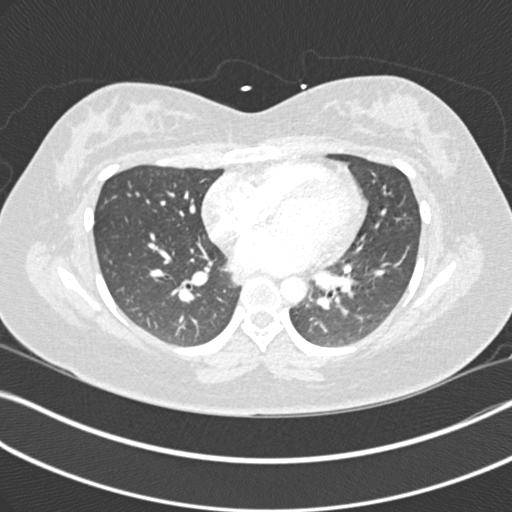
[im 127/244  soft-tissue]
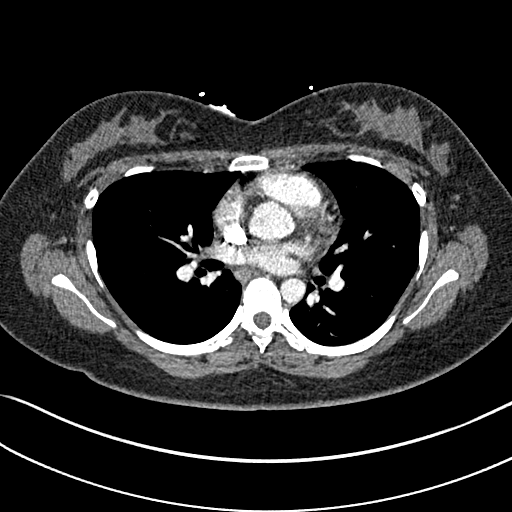
[im 138/244  lung]
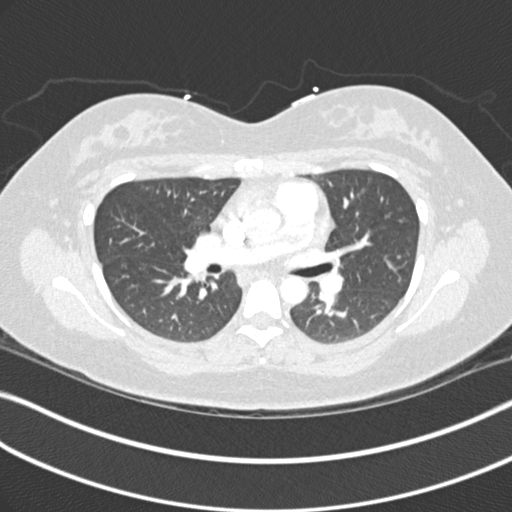
[im 148/244  soft-tissue]
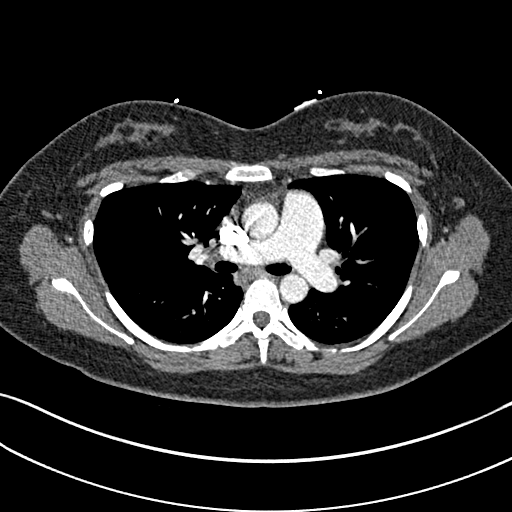
[im 170/244  lung]
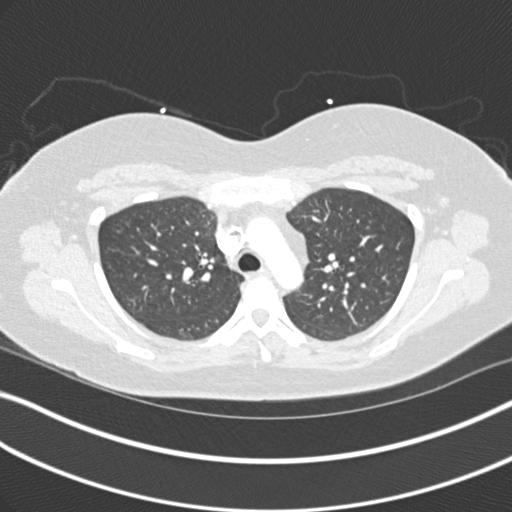
[im 180/244  soft-tissue]
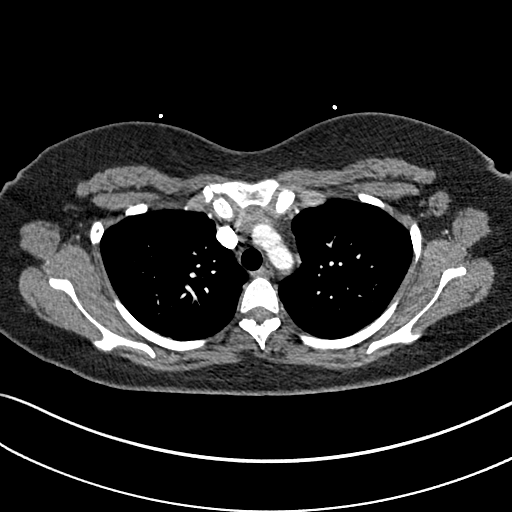
[im 201/244  lung]
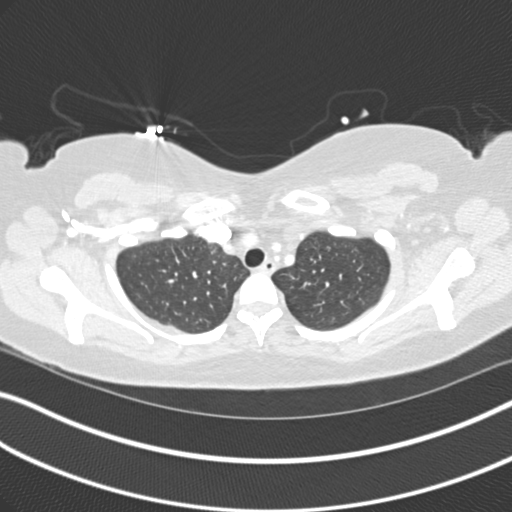
[im 212/244  soft-tissue]
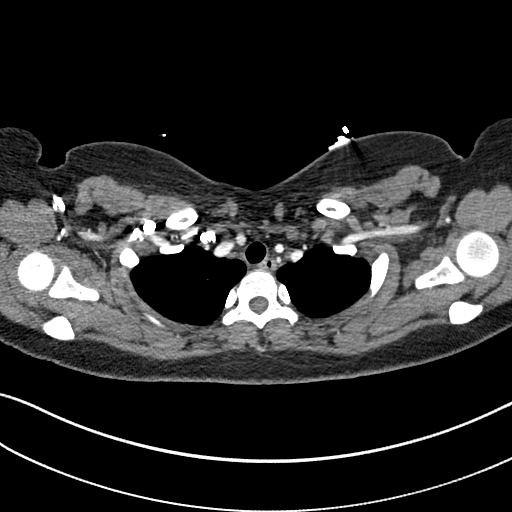
[im 233/244  lung]
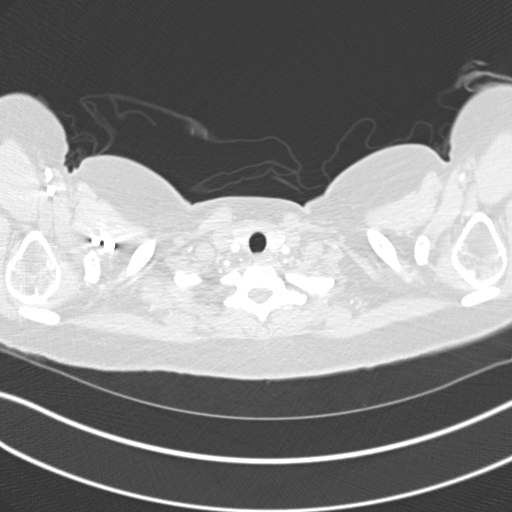

[Series 8: coronal mpr · coronal · 0.47mm/px · 3 of 132 slices shown]
[im 33/132  soft-tissue]
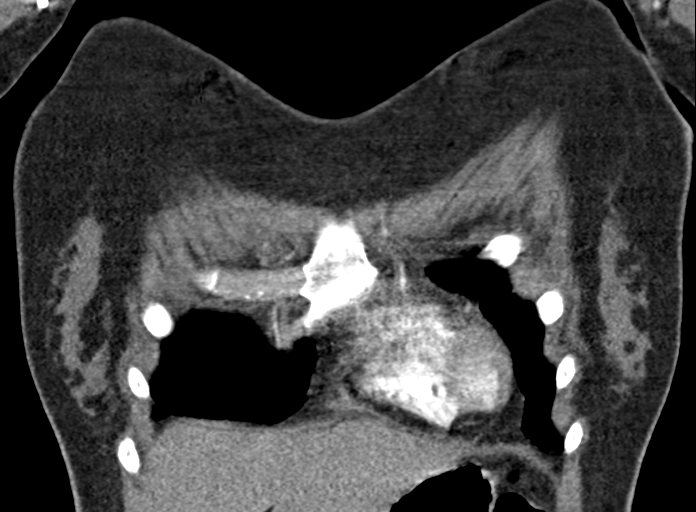
[im 66/132  soft-tissue]
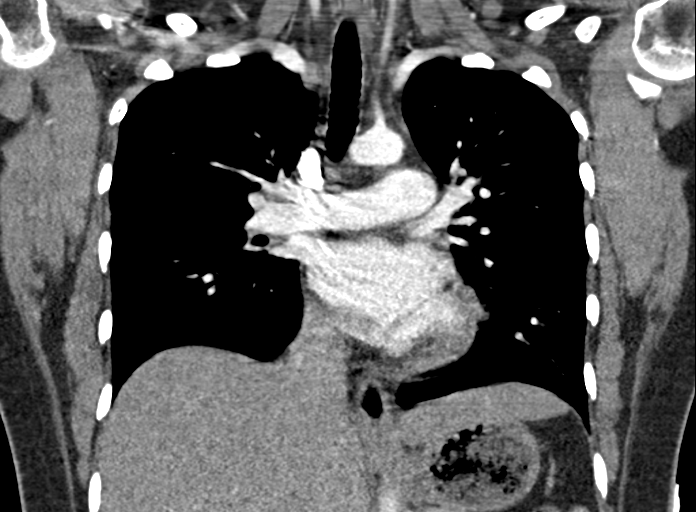
[im 99/132  soft-tissue]
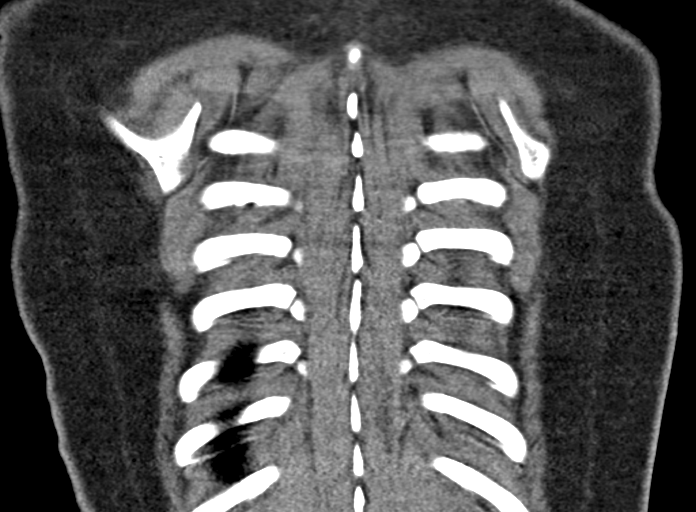

[18 of 46 positions shown; findings below may reference images not displayed]

FINDINGS: Cardiovascular: There is slightly suboptimal opacification of the
main pulmonary artery. However no central or segmental pulmonary
embolism is noted. The heart is normal in size. No pericardial
effusion or thickening. No evidence right heart strain. There is
normal three-vessel brachiocephalic anatomy without proximal
stenosis. The thoracic aorta is normal in appearance.

Mediastinum/Nodes: No hilar, mediastinal, or axillary adenopathy.
Thyroid gland, trachea, and esophagus demonstrate no significant
findings.

Lungs/Pleura: The lungs are clear. No pleural effusion or
pneumothorax. No airspace consolidation.

Upper Abdomen: No acute abnormalities present in the visualized
portions of the upper abdomen.

Musculoskeletal: No chest wall abnormality. No acute or significant
osseous findings.

Review of the MIP images confirms the above findings.
IMPRESSION: No central, segmental, or subsegmental pulmonary embolism.

No other acute intrathoracic pathology to explain the patient's
symptoms.

## 2022-10-15 ENCOUNTER — Ambulatory Visit: Payer: Commercial Managed Care - PPO | Admitting: Dermatology

## 2022-10-29 ENCOUNTER — Ambulatory Visit: Payer: Commercial Managed Care - PPO | Admitting: Dermatology

## 2023-01-16 ENCOUNTER — Ambulatory Visit (INDEPENDENT_AMBULATORY_CARE_PROVIDER_SITE_OTHER): Payer: Self-pay | Admitting: Dermatology

## 2023-01-16 ENCOUNTER — Encounter: Payer: Self-pay | Admitting: Dermatology

## 2023-01-16 VITALS — BP 125/82

## 2023-01-16 DIAGNOSIS — L730 Acne keloid: Secondary | ICD-10-CM

## 2023-01-16 DIAGNOSIS — L700 Acne vulgaris: Secondary | ICD-10-CM

## 2023-01-16 MED ORDER — TRETINOIN 0.05 % EX CREA
TOPICAL_CREAM | Freq: Every evening | CUTANEOUS | 2 refills | Status: DC
Start: 2023-01-16 — End: 2023-04-18

## 2023-01-16 MED ORDER — CLINDAMYCIN PHOSPHATE 1 % EX SWAB: 1.0000 | Freq: Every day | CUTANEOUS | 2 refills | Status: DC

## 2023-01-16 NOTE — Progress Notes (Signed)
   New Patient Visit   Subjective  Robin Mendez is a 22 y.o. 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 She has tried OTC Differin gel, Panoxyl wash without improvement No prior prescriptions.   The following portions of the chart were reviewed this encounter and updated as appropriate: medications, allergies, medical history  Review of Systems:  No other skin or systemic complaints except as noted in HPI or Assessment and Plan.  Objective  Well appearing patient in no apparent distress; mood and affect are within normal limits.  Areas Examined: Face, chest and back  Relevant exam findings are noted in the Assessment and Plan.   Assessment & Plan    ACNE VULGARIS Exam: Open comedones and inflammatory papules on the face  Not at Goal  Treatment Plan: Clindamycin swabs every morning  Tretinoin 0.05% cream 3 night  per week  Hyaluronic acid lotion before application of Tretinoin follow with moisturizer.  Discussed treatment with oral Spironolactone and Doxycyline.  She currently declines oral medications.   Avoid Salicylic Acid and Benzoyl peroxide products    Acne Scarring Exam: ice pick and box scars at the bilateral cheeks.  Counseling: -Explained to patient that acnes scars can improve with topical retinoids  -We will continue to increase strength of topical retinoids over time as tolerated  -Once acne is under control we will discuss laser or microneedling as long term treatment options.    Treatment Plan: Tretinoin 0.05% cream 3 nights per week    Return in about 3 months (around 04/18/2023) for Acne Follow Up.  Jaclynn Guarneri, CMA, am acting as scribe for [employer], DO.   Documentation: I have reviewed the above documentation for accuracy and completeness, and I agree with the above.  Langston Reusing, DO

## 2023-01-16 NOTE — Patient Instructions (Signed)
Avoid Salicylic Acid and Benzoyl peroxide products                                                                Daily Skincare Regimen: Patient Handout  Morning Routine:  Cleanse: Start your day by washing your face with a gentle cleanser. Choose a product suitable for your skin type, such as CeraVe, Neutrogena, Cetaphil, or LaRoche-Posay. Gently massage the cleanser onto damp skin, then rinse thoroughly with lukewarm water and pat dry with a clean towel.  Apply Medication: Apply prescription medication Clindamycin swabs to face   Moisturize: Finish your morning routine by applying a moisturizer to your face and neck. Opt for a moisturizer that has SPF included, is suitable for your skin type and provides hydration without clogging pores. Consider brands like CeraVe, Neutrogena, Cetaphil, or LaRoche-Posay for effective hydration and skin barrier support.  Evening Routine:  Cleanse: Before bed, cleanse your face again with a gentle cleanser to remove makeup, dirt, and impurities accumulated throughout the day. Use the same cleanser as in the morning and follow the same steps for cleansing.  Apply Medication: Ensure that your skin is completely dry before applying any topical treatments to maximize their efficacy.  Apply prescription medication Tretinoin gel on Monday, Wednesday, and Friday.    Moisturize: After applying any medications, moisturize your skin to seal in hydration and support skin barrier function. Choose a moisturizer suitable for nighttime use that helps replenish moisture overnight. Look for products from trusted brands like CeraVe, Neutrogena, Cetaphil, or LaRoche-Posay for optimal results.   Additional Tips:  Sun Protection: During the daytime, it is essential to apply a broad-spectrum sunscreen with an SPF of 30 or higher to protect your skin from harmful UV rays. Apply sunscreen as the last step of your morning skincare routine and reapply throughout the  day as needed, especially if you will be spending time outdoors.  Hydration: Drink plenty of water throughout the day to keep your skin hydrated from within.  Healthy Lifestyle: Maintain a balanced diet, get regular exercise, manage stress, and prioritize adequate sleep to support overall skin health.   Following a consistent daily skincare regimen can help promote healthy, radiant skin and minimize the risk of common skin issues. Be patient and consistent with your routine, and remember to listen to your skin's needs   Due to recent changes in healthcare laws, you may see results of your pathology and/or laboratory studies on MyChart before the doctors have had a chance to review them. We understand that in some cases there may be results that are confusing or concerning to you. Please understand that not all results are received at the same time and often the doctors may need to interpret multiple results in order to provide you with the best plan of care or course of treatment. Therefore, we ask that you please give [place] 2 business days to thoroughly review all your results before contacting the office for clarification. Should we see a critical lab result, you will be contacted sooner.   If You Need Anything After Your Visit  If you have any questions or concerns for your doctor, please call our main line at [phone] If no one answers, please leave a voicemail as  directed and we will return your call as soon as possible. Messages left after 4 pm will be answered the following business day.   You may also send [place] a message via MyChart. We typically respond to MyChart messages within 1-2 business days.  For prescription refills, please ask your pharmacy to contact our office. Our fax number is [phone].  If you have an urgent issue when the clinic is closed that cannot wait until the next business day, you can page your doctor at the number below.    Please note that while we do our  best to be available for urgent issues outside of office hours, we are not available 24/7.   If you have an urgent issue and are unable to reach [place], you may choose to seek medical care at your doctor's office, retail clinic, urgent care center, or emergency room.  If you have a medical emergency, please immediately call 911 or go to the emergency department. In the event of inclement weather, please call our main line at [phone] for an update on the status of any delays or closures.  Dermatology Medication Tips: Please keep the boxes that topical medications come in in order to help keep track of the instructions about where and how to use these. Pharmacies typically print the medication instructions only on the boxes and not directly on the medication tubes.   If your medication is too expensive, please contact our office at [phone] or send [place] a message through MyChart.   We are unable to tell what your co-pay for medications will be in advance as this is different depending on your insurance coverage. However, we may be able to find a substitute medication at lower cost or fill out paperwork to get insurance to cover a needed medication.   If a prior authorization is required to get your medication covered by your insurance company, please allow [place] 1-2 business days to complete this process.  Drug prices often vary depending on where the prescription is filled and some pharmacies may offer cheaper prices.  The website www.goodrx.com contains coupons for medications through different pharmacies. The prices here do not account for what the cost may be with help from insurance (it may be cheaper with your insurance), but the website can give you the price if you did not use any insurance.  - You can print the associated coupon and take it with your prescription to the pharmacy.  - You may also stop by our office during regular business hours and pick up a GoodRx coupon card.  - If you  need your prescription sent electronically to a different pharmacy, notify our office through [facility] or by phone at [phone]

## 2023-04-18 ENCOUNTER — Encounter: Payer: Self-pay | Admitting: Dermatology

## 2023-04-18 ENCOUNTER — Ambulatory Visit (INDEPENDENT_AMBULATORY_CARE_PROVIDER_SITE_OTHER): Payer: Self-pay | Admitting: Dermatology

## 2023-04-18 VITALS — BP 122/83

## 2023-04-18 DIAGNOSIS — L700 Acne vulgaris: Secondary | ICD-10-CM

## 2023-04-18 DIAGNOSIS — L659 Nonscarring hair loss, unspecified: Secondary | ICD-10-CM

## 2023-04-18 MED ORDER — ARAZLO 0.045 % EX LOTN: 1.0000 "application " | TOPICAL_LOTION | CUTANEOUS | 3 refills | Status: DC

## 2023-04-18 MED ORDER — BIMATOPROST 0.03 % EX SOLN: 1.0000 "application " | Freq: Every day | CUTANEOUS | 4 refills | Status: DC

## 2023-04-18 NOTE — Progress Notes (Signed)
   Follow-Up Visit   Subjective  Robin Mendez is a 22 y.o. female who presents for the following: She is for acne follow up today. She is doing much better since her last appointment. She is treating with Clindamycin pads Tuesday, Thursday and on the weekends and Treinoin 0.05% cream Monday, Wednesday and Friday nights.   The following portions of the chart were reviewed this encounter and updated as appropriate: medications, allergies, medical history  Review of Systems:  No other skin or systemic complaints except as noted in HPI or Assessment and Plan.  Objective  Well appearing patient in no apparent distress; mood and affect are within normal limits.    Relevant exam findings are noted in the Assessment and Plan.    Assessment & Plan   ACNE VULGARIS Exam: Open comedones and inflammatory papules  Improving  Treatment Plan:  Discontinue Tretinoin 0.05% cream.  Start Arazlo lotion at bedtime 3 nights per week.  Continue Clindamycin wipes in the morning Tuesday, Thursday and weekends.  Hypotrichosis of eyelashes  Treatment Plan: Start Latisse qhs         Return in about 7 months (around 11/16/2023) for Acne.    Documentation: I have reviewed the above documentation for accuracy and completeness, and I agree with the above.  Langston Reusing, DO

## 2023-04-18 NOTE — Patient Instructions (Addendum)
Hello Robin Mendez,   Thank you for visiting our clinic today. Your dedication to improving your skin health is greatly appreciated. Below is a summary of the key instructions from today's consultation:  - Medication Changes:   - Discontinue Tretinoin 0.05%.   - Start Arazlo (Tazarotene): A stronger retinoid.     - Application: Apply a pea-sized amount to the forehead, cheeks, and chin three nights a week.     - Adjustment: Reduce application to twice a week in very cold weather.     - Combination: Use in conjunction with a moisturizer.     - Precaution: Wait 15-20 minutes before lying down due to stickiness.   - Continue: Using clindamycin pads in the morning.  - Daily Skincare Routine:   - Morning: Wash face, use clindamycin pads, apply moisturizer, and sunscreen.   - Evening: Wash face, apply [place] hyaluronic acid, use Arazlo, followed by moisturizer if needed.  - Prescriptions and Pharmacy:   - Arazlo: Prescription will be processed through a special pharmacy to ensure a reduced cost with your insurance [facility]).   - Latisse for Eyelash Growth:     - Size: Opted for the 3 mL size for cost efficiency.     - Refills: Four refills provided.  - Follow-Up:   - Next Visit: No need for a return visit until April to discuss potential adjustments in the frequency of Arazlo usage.   Please do not hesitate to reach out if you have any further questions or concerns about your treatment plan.  Warm regards,  Dr. Langston Reusing Dermatology     Your prescription was sent to [facility] in [place]. A representative from [facility] will contact you within 2 business hours to verify your address and insurance information to schedule a free delivery. If for any reason you do not receive a phone call from them, please reach out to them. Their phone number is [phone] and their hours are Monday-Friday 9:00 am-5:00 pm.      Important Information  Due to recent  changes in healthcare laws, you may see results of your pathology and/or laboratory studies on MyChart before the doctors have had a chance to review them. We understand that in some cases there may be results that are confusing or concerning to you. Please understand that not all results are received at the same time and often the doctors may need to interpret multiple results in order to provide you with the best plan of care or course of treatment. Therefore, we ask that you please give [place] 2 business days to thoroughly review all your results before contacting the office for clarification. Should we see a critical lab result, you will be contacted sooner.   If You Need Anything After Your Visit  If you have any questions or concerns for your doctor, please call our main line at [phone] If no one answers, please leave a voicemail as directed and we will return your call as soon as possible. Messages left after 4 pm will be answered the following business day.   You may also send [place] a message via MyChart. We typically respond to MyChart messages within 1-2 business days.  For prescription refills, please ask your pharmacy to contact our office. Our fax number is [phone].  If you have an urgent issue when the clinic is closed that cannot wait until the next business day, you can page your doctor at the number below.    Please note  that while we do our best to be available for urgent issues outside of office hours, we are not available 24/7.   If you have an urgent issue and are unable to reach [place], you may choose to seek medical care at your doctor's office, retail clinic, urgent care center, or emergency room.  If you have a medical emergency, please immediately call 911 or go to the emergency department. In the event of inclement weather, please call our main line at [phone] for an update on the status of any delays or closures.  Dermatology Medication Tips: Please keep the boxes  that topical medications come in in order to help keep track of the instructions about where and how to use these. Pharmacies typically print the medication instructions only on the boxes and not directly on the medication tubes.   If your medication is too expensive, please contact our office at [phone] or send [place] a message through MyChart.   We are unable to tell what your co-pay for medications will be in advance as this is different depending on your insurance coverage. However, we may be able to find a substitute medication at lower cost or fill out paperwork to get insurance to cover a needed medication.   If a prior authorization is required to get your medication covered by your insurance company, please allow [place] 1-2 business days to complete this process.  Drug prices often vary depending on where the prescription is filled and some pharmacies may offer cheaper prices.  The website www.goodrx.com contains coupons for medications through different pharmacies. The prices here do not account for what the cost may be with help from insurance (it may be cheaper with your insurance), but the website can give you the price if you did not use any insurance.  - You can print the associated coupon and take it with your prescription to the pharmacy.  - You may also stop by our office during regular business hours and pick up a GoodRx coupon card.  - If you need your prescription sent electronically to a different pharmacy, notify our office through [facility] or by phone at [phone]

## 2023-06-20 ENCOUNTER — Emergency Department (HOSPITAL_COMMUNITY): Payer: BC Managed Care – PPO

## 2023-06-20 ENCOUNTER — Emergency Department (HOSPITAL_COMMUNITY)
Admission: EM | Admit: 2023-06-20 | Discharge: 2023-06-20 | Disposition: A | Discharge status: Home / Self Care | Payer: BC Managed Care – PPO | Attending: Emergency Medicine | Admitting: Emergency Medicine

## 2023-06-20 DIAGNOSIS — R109 Unspecified abdominal pain: Secondary | ICD-10-CM | POA: Insufficient documentation

## 2023-06-20 DIAGNOSIS — R202 Paresthesia of skin: Secondary | ICD-10-CM | POA: Diagnosis not present

## 2023-06-20 DIAGNOSIS — R079 Chest pain, unspecified: Secondary | ICD-10-CM | POA: Insufficient documentation

## 2023-06-20 DIAGNOSIS — Y92410 Unspecified street and highway as the place of occurrence of the external cause: Secondary | ICD-10-CM | POA: Insufficient documentation

## 2023-06-20 LAB — URINALYSIS, ROUTINE W REFLEX MICROSCOPIC
Bilirubin Urine: NEGATIVE
Glucose, UA: NEGATIVE mg/dL
Ketones, ur: NEGATIVE mg/dL
Leukocytes,Ua: NEGATIVE
Nitrite: NEGATIVE
Protein, ur: NEGATIVE mg/dL
Specific Gravity, Urine: 1.011 (ref 1.005–1.030)
pH: 7 (ref 5.0–8.0)

## 2023-06-20 LAB — CBC WITH DIFFERENTIAL/PLATELET
Abs Immature Granulocytes: 0.02 10*3/uL (ref 0.00–0.07)
Basophils Absolute: 0 10*3/uL (ref 0.0–0.1)
Basophils Relative: 1 %
Eosinophils Absolute: 0.1 10*3/uL (ref 0.0–0.5)
Eosinophils Relative: 1 %
HCT: 37.8 % (ref 36.0–46.0)
Hemoglobin: 12.1 g/dL (ref 12.0–15.0)
Immature Granulocytes: 0 %
Lymphocytes Relative: 21 %
Lymphs Abs: 1.8 10*3/uL (ref 0.7–4.0)
MCH: 26.9 pg (ref 26.0–34.0)
MCHC: 32 g/dL (ref 30.0–36.0)
MCV: 84 fL (ref 80.0–100.0)
Monocytes Absolute: 0.8 10*3/uL (ref 0.1–1.0)
Monocytes Relative: 9 %
Neutro Abs: 5.8 10*3/uL (ref 1.7–7.7)
Neutrophils Relative %: 68 %
Platelets: 401 10*3/uL — ABNORMAL HIGH (ref 150–400)
RBC: 4.5 MIL/uL (ref 3.87–5.11)
RDW: 15.8 % — ABNORMAL HIGH (ref 11.5–15.5)
WBC: 8.4 10*3/uL (ref 4.0–10.5)
nRBC: 0 % (ref 0.0–0.2)

## 2023-06-20 LAB — BASIC METABOLIC PANEL
Anion gap: 6 (ref 5–15)
BUN: 11 mg/dL (ref 6–20)
CO2: 24 mmol/L (ref 22–32)
Calcium: 9.4 mg/dL (ref 8.9–10.3)
Chloride: 107 mmol/L (ref 98–111)
Creatinine, Ser: 0.54 mg/dL (ref 0.44–1.00)
GFR, Estimated: >60 mL/min (ref >60)
Glucose, Bld: 102 mg/dL — ABNORMAL HIGH (ref 70–99)
Potassium: 3.8 mmol/L (ref 3.5–5.1)
Sodium: 137 mmol/L (ref 135–145)

## 2023-06-20 MED ORDER — ACETAMINOPHEN 500 MG PO TABS
1000.0000 mg | ORAL_TABLET | Freq: Once | ORAL | Status: AC
  Administered 2023-06-20: 1000 mg via ORAL
  Filled 2023-06-20: qty 2

## 2023-06-20 MED ORDER — CYCLOBENZAPRINE HCL 10 MG PO TABS
5.0000 mg | ORAL_TABLET | Freq: Every day | ORAL | 0 refills | Status: AC
Start: 2023-06-20 — End: 2023-06-27
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
## 2023-08-14 NOTE — L&D Delivery Note (Addendum)
 Delivery Note SROM at delivery.  Clear fluid. At 11:06 AM a viable and healthy female was delivered via Vaginal, Spontaneous (Presentation: OA).  Shoulders delivered easily. Infant placed skin-to-skin w/ mom. Delayed cord clamping x 1 minute. Cord clamped x 2 and cut by FOB. APGAR: 8, 9; weight 6 lb 9.8 oz (3000 g).   Placenta status: Spontaneous, retained placental fragments removed with manual sweep of uterus x 3. Cord: 3 vessels with the following complications: None.  Cord pH: N/A  Anesthesia: None Episiotomy: None Lacerations: None Suture Repair: N/A.  Intact. Est. Blood Loss (mL): 435 cc  Mom to postpartum.  Baby to Couplet care / Skin to Skin. Placenta to: LD Feeding: Breast Circ: Yes Contraception: BTL  Manual sweep performed for retained placental fragments.  Bleeding stable.  Ancef x 1 ordered.  Ramelo Oetken  Claudene 08/09/2024, 9:06 PM

## 2023-11-18 ENCOUNTER — Ambulatory Visit (INDEPENDENT_AMBULATORY_CARE_PROVIDER_SITE_OTHER): Payer: Self-pay | Admitting: Dermatology

## 2023-11-18 ENCOUNTER — Encounter: Payer: Self-pay | Admitting: Dermatology

## 2023-11-18 VITALS — BP 137/81

## 2023-11-18 DIAGNOSIS — L700 Acne vulgaris: Secondary | ICD-10-CM

## 2023-11-18 DIAGNOSIS — L810 Postinflammatory hyperpigmentation: Secondary | ICD-10-CM | POA: Diagnosis not present

## 2023-11-18 DIAGNOSIS — L659 Nonscarring hair loss, unspecified: Secondary | ICD-10-CM

## 2023-11-18 MED ORDER — BIMATOPROST 0.03 % EX SOLN: 1.0000 "application " | Freq: Every day | CUTANEOUS | 4 refills | Status: DC

## 2023-11-18 MED ORDER — CLINDAMYCIN PHOSPHATE 1 % EX SWAB: 1.0000 | Freq: Every day | CUTANEOUS | 2 refills | Status: DC

## 2023-11-18 NOTE — Progress Notes (Signed)
   Follow-Up Visit   Subjective  Robin Mendez is a 23 y.o. female who presents for the following: acne  Patient present today for follow up visit. Patient was last evaluated on 04/18/23. At this visit patient was prescribed Latisse, Arazlo (MWF) & Clindamycin swabs(QAM). Patient reports sxs are better. Patient denies medication changes.  The following portions of the chart were reviewed this encounter and updated as appropriate: medications, allergies, medical history  Review of Systems:  No other skin or systemic complaints except as noted in HPI or Assessment and Plan.  Objective  Well appearing patient in no apparent distress; mood and affect are within normal limits.   A focused examination was performed of the following areas: face   Relevant exam findings are noted in the Assessment and Plan.           Assessment & Plan   ACNE VULGARIS and PIH Exam: Open comedones and inflammatory papules.  Secondary brown macules  - Assessment: Patient currently using Arazlo (tazarotene) three nights a week for acne treatment, tolerating it well without excessive dryness. Acne scarring and hyperpigmentation persist. Patient declines oral medication at this time.  - Plan:    Increase Arazlo application to Monday through Friday nights    Apply hyaluronic acid before Arazlo    Introduce Doctor Dianne Fortune ultra-gentle peeling pads on weekends (Saturday night or Sunday morning)    Add Eucerin Radiant Tone serum with thiamidol for dark spots, to be used morning and night    Continue current sunscreen (Innistree) for daily use    Add Neutrogena Sheer Mineral sunscreen for beach or extended sun exposure    Patient informed about spironolactone as a future option if needed   Hypotrichosis  Exam: thinning of eyelash hairs  Treatment Plan: - Send rx for Latisse to pharmacy  -Pt to apply daily      No follow-ups on file.    Documentation: I have reviewed the above documentation  for accuracy and completeness, and I agree with the above.  I, Shirron Louanne Roussel, CMA, am acting as scribe for [employer], DO.   Louana Roup, DO

## 2023-11-18 NOTE — Patient Instructions (Addendum)
 Hello Adlynn,  Thank you for visiting today. Here is a summary of the key instructions:  - Medications:   - Use Arazlo Monday through Friday at night   - Apply hyaluronic acid before Arazlo if needed  - Skin Care Routine:   - Use Doctor Jim Desanctis ultra-gentle peeling pads on weekends     - Apply Saturday night or Sunday morning     - Wash face, apply peel for 1-2 minutes, then use neutralizer   - Use Eucerin Radiant Tone serum with thiamidol morning and night   - Morning routine: wash face, apply Excedrin serum, then sunscreen   - Night routine: apply Eucerin serum, then Arazlo  - Sun Protection:   - Continue using Innistree sunscreen for daily use   - For beach, apply [employer] Mineral sunscreen on top  - Follow-up:   - Next appointment in October to adjust regimen for weather change  Please reach out if you have any questions or concerns.  Warm regards,  Dr. [facility]       Important Information  Due to recent changes in healthcare laws, you may see results of your pathology and/or laboratory studies on MyChart before the doctors have had a chance to review them. We understand that in some cases there may be results that are confusing or concerning to you. Please understand that not all results are received at the same time and often the doctors may need to interpret multiple results in order to provide you with the best plan of care or course of treatment. Therefore, we ask that you please give [place] 2 business days to thoroughly review all your results before contacting the office for clarification. Should we see a critical lab result, you will be contacted sooner.   If You Need Anything After Your Visit  If you have any questions or concerns for your doctor, please call our main line at [phone] If no one answers, please leave a voicemail as directed and we will return your call as soon as possible. Messages left after 4 pm will be answered  the following business day.   You may also send [place] a message via MyChart. We typically respond to MyChart messages within 1-2 business days.  For prescription refills, please ask your pharmacy to contact our office. Our fax number is [phone].  If you have an urgent issue when the clinic is closed that cannot wait until the next business day, you can page your doctor at the number below.    Please note that while we do our best to be available for urgent issues outside of office hours, we are not available 24/7.   If you have an urgent issue and are unable to reach [place], you may choose to seek medical care at your doctor's office, retail clinic, urgent care center, or emergency room.  If you have a medical emergency, please immediately call 911 or go to the emergency department. In the event of inclement weather, please call our main line at [phone] for an update on the status of any delays or closures.  Dermatology Medication Tips: Please keep the boxes that topical medications come in in order to help keep track of the instructions about where and how to use these. Pharmacies typically print the medication instructions only on the boxes and not directly on the medication tubes.   If your medication is too expensive, please contact our office at [phone] or send [place] a message through MyChart.  We are unable to tell what your co-pay for medications will be in advance as this is different depending on your insurance coverage. However, we may be able to find a substitute medication at lower cost or fill out paperwork to get insurance to cover a needed medication.   If a prior authorization is required to get your medication covered by your insurance company, please allow [place] 1-2 business days to complete this process.  Drug prices often vary depending on where the prescription is filled and some pharmacies may offer cheaper prices.  The website www.goodrx.com contains coupons for  medications through different pharmacies. The prices here do not account for what the cost may be with help from insurance (it may be cheaper with your insurance), but the website can give you the price if you did not use any insurance.  - You can print the associated coupon and take it with your prescription to the pharmacy.  - You may also stop by our office during regular business hours and pick up a GoodRx coupon card.  - If you need your prescription sent electronically to a different pharmacy, notify our office through [facility] or by phone at [phone]

## 2024-01-09 ENCOUNTER — Other Ambulatory Visit: Payer: Self-pay | Admitting: Nurse Practitioner

## 2024-01-09 DIAGNOSIS — Z3687 Encounter for antenatal screening for uncertain dates: Secondary | ICD-10-CM

## 2024-01-09 LAB — HEPATITIS C ANTIBODY: HCV Ab: NEGATIVE

## 2024-01-09 LAB — OB RESULTS CONSOLE VARICELLA ZOSTER ANTIBODY, IGG: Varicella: NON-IMMUNE/NOT IMMUNE

## 2024-01-09 LAB — OB RESULTS CONSOLE PLATELET COUNT: Platelets: 436

## 2024-01-09 LAB — OB RESULTS CONSOLE GC/CHLAMYDIA: Chlamydia: NEGATIVE

## 2024-01-09 LAB — OB RESULTS CONSOLE HGB/HCT, BLOOD: Hemoglobin: 13

## 2024-01-09 LAB — OB RESULTS CONSOLE RUBELLA ANTIBODY, IGM: Rubella: IMMUNE

## 2024-01-13 ENCOUNTER — Other Ambulatory Visit: Payer: Self-pay | Admitting: Nurse Practitioner

## 2024-01-13 ENCOUNTER — Ambulatory Visit (HOSPITAL_COMMUNITY)
Admission: RE | Admit: 2024-01-13 | Discharge: 2024-01-13 | Disposition: A | Discharge status: Home / Self Care | Source: Ambulatory Visit | Attending: Nurse Practitioner | Admitting: Nurse Practitioner

## 2024-01-13 DIAGNOSIS — Z3687 Encounter for antenatal screening for uncertain dates: Secondary | ICD-10-CM | POA: Diagnosis present

## 2024-02-10 ENCOUNTER — Other Ambulatory Visit: Payer: Self-pay | Admitting: Obstetrics & Gynecology

## 2024-02-10 DIAGNOSIS — Z363 Encounter for antenatal screening for malformations: Secondary | ICD-10-CM

## 2024-03-19 ENCOUNTER — Ambulatory Visit (HOSPITAL_BASED_OUTPATIENT_CLINIC_OR_DEPARTMENT_OTHER)

## 2024-03-19 ENCOUNTER — Other Ambulatory Visit: Payer: Self-pay | Admitting: *Deleted

## 2024-03-19 ENCOUNTER — Ambulatory Visit: Attending: Obstetrics and Gynecology | Admitting: Obstetrics

## 2024-03-19 VITALS — BP 110/63 | HR 80

## 2024-03-19 DIAGNOSIS — Z3689 Encounter for other specified antenatal screening: Secondary | ICD-10-CM

## 2024-03-19 DIAGNOSIS — Z363 Encounter for antenatal screening for malformations: Secondary | ICD-10-CM | POA: Diagnosis not present

## 2024-03-19 DIAGNOSIS — Z3A19 19 weeks gestation of pregnancy: Secondary | ICD-10-CM | POA: Insufficient documentation

## 2024-03-19 DIAGNOSIS — O358XX0 Maternal care for other (suspected) fetal abnormality and damage, not applicable or unspecified: Secondary | ICD-10-CM | POA: Insufficient documentation

## 2024-03-19 DIAGNOSIS — Z362 Encounter for other antenatal screening follow-up: Secondary | ICD-10-CM

## 2024-03-19 NOTE — Progress Notes (Signed)
 MFM Consult Note  Robin Mendez is currently at 19 weeks and 0 days.  She was seen for a detailed fetal anatomy scan.  She denies any significant past medical history and denies any problems in her current pregnancy.    She had a cell free DNA test earlier in her pregnancy which indicated a low risk for trisomy 72, 34, and 13. A female fetus is predicted.   She was informed that the fetal growth and amniotic fluid level were appropriate for her gestational age.   There were no obvious fetal anomalies noted on today's ultrasound exam.  However, today's exam was limited due to the fetal position.  The patient was informed that anomalies may be missed due to technical limitations. If the fetus is in a suboptimal position or maternal habitus is increased, visualization of the fetus in the maternal uterus may be impaired.  A follow-up exam was scheduled in 5 weeks to complete the views of the fetal anatomy and to assess the fetal growth.    The patient stated that all of her questions were answered today.  A total of 30 minutes was spent counseling and coordinating the care for this patient.  Greater than 50% of the time was spent in direct face-to-face contact.

## 2024-04-14 ENCOUNTER — Encounter: Payer: Self-pay | Admitting: *Deleted

## 2024-04-14 DIAGNOSIS — O359XX0 Maternal care for (suspected) fetal abnormality and damage, unspecified, not applicable or unspecified: Secondary | ICD-10-CM | POA: Insufficient documentation

## 2024-04-23 ENCOUNTER — Ambulatory Visit (HOSPITAL_BASED_OUTPATIENT_CLINIC_OR_DEPARTMENT_OTHER)

## 2024-04-23 ENCOUNTER — Ambulatory Visit: Attending: Obstetrics | Admitting: Obstetrics

## 2024-04-23 DIAGNOSIS — O358XX0 Maternal care for other (suspected) fetal abnormality and damage, not applicable or unspecified: Secondary | ICD-10-CM | POA: Diagnosis present

## 2024-04-23 DIAGNOSIS — Z3689 Encounter for other specified antenatal screening: Secondary | ICD-10-CM

## 2024-04-23 DIAGNOSIS — Z3482 Encounter for supervision of other normal pregnancy, second trimester: Secondary | ICD-10-CM

## 2024-04-23 DIAGNOSIS — Z362 Encounter for other antenatal screening follow-up: Secondary | ICD-10-CM | POA: Diagnosis not present

## 2024-04-23 DIAGNOSIS — O321XX0 Maternal care for breech presentation, not applicable or unspecified: Secondary | ICD-10-CM | POA: Insufficient documentation

## 2024-04-23 DIAGNOSIS — Z3A24 24 weeks gestation of pregnancy: Secondary | ICD-10-CM | POA: Insufficient documentation

## 2024-04-23 NOTE — Progress Notes (Signed)
 MFM Note  Robin Mendez is currently at 24 weeks and 0 days.  She was seen as the views of the fetal anatomy were unable to be fully visualized during her prior exam.    She denies any problems since her last exam.  Sonographic findings Single intrauterine pregnancy at 24w 0d.  Fetal cardiac activity:  Observed and appears normal. Presentation: Breech. Interval fetal anatomy appears normal. Fetal biometry shows the estimated fetal weight at the 32nd percentile. Amniotic fluid volume: Within normal limits. MVP: 6.66 cm. Placenta: Posterior.  The views of the fetal anatomy were visualized today.  There were no obvious anomalies noted.    The limitations of ultrasound in the detection of all anomalies was discussed.    Follow-up as indicated.

## 2024-05-20 ENCOUNTER — Ambulatory Visit: Admitting: Dermatology

## 2024-06-01 LAB — OB RESULTS CONSOLE HIV ANTIBODY (ROUTINE TESTING): HIV: NONREACTIVE

## 2024-06-11 ENCOUNTER — Other Ambulatory Visit: Payer: Self-pay

## 2024-06-11 MED ORDER — FLUZONE 0.5 ML IM SUSY
0.5000 mL | PREFILLED_SYRINGE | Freq: Once | INTRAMUSCULAR | 0 refills | Status: AC
  Filled 2024-06-11: qty 0.5, 1d supply, fill #0

## 2024-07-23 LAB — OB RESULTS CONSOLE GC/CHLAMYDIA
Chlamydia: NEGATIVE
Neisseria Gonorrhea: NEGATIVE

## 2024-07-23 LAB — OB RESULTS CONSOLE GBS: GBS: POSITIVE

## 2024-08-06 ENCOUNTER — Encounter (HOSPITAL_COMMUNITY): Payer: Self-pay | Admitting: Obstetrics & Gynecology

## 2024-08-06 ENCOUNTER — Inpatient Hospital Stay (HOSPITAL_COMMUNITY)
Admission: AD | Admit: 2024-08-06 | Discharge: 2024-08-06 | Disposition: A | Discharge status: Home / Self Care | Source: Home / Self Care | Attending: Obstetrics & Gynecology | Admitting: Obstetrics & Gynecology

## 2024-08-06 ENCOUNTER — Other Ambulatory Visit: Payer: Self-pay

## 2024-08-06 DIAGNOSIS — Z3A39 39 weeks gestation of pregnancy: Secondary | ICD-10-CM | POA: Insufficient documentation

## 2024-08-06 DIAGNOSIS — O471 False labor at or after 37 completed weeks of gestation: Secondary | ICD-10-CM | POA: Insufficient documentation

## 2024-08-06 DIAGNOSIS — O479 False labor, unspecified: Secondary | ICD-10-CM

## 2024-08-06 NOTE — MAU Note (Signed)
 MAU Labor Evaluation RN Medical Screening Exam: RN Assessment:  .Laci Frenkel is a 23 y.o. at [redacted]w[redacted]d here in MAU for evaluation of labor. See RN triage note.   Pain Score: 3  Pain Location: Abdomen  Cervical exam:  Dilation: 3 Effacement (%): 50 Station: -3 Exam by:: Nat Mule, RN  Fetal Monitoring: Baseline Rate (A): 145 bpm Variability: Moderate Accelerations: 15 x 15 Decelerations: None Contraction Frequency (min): UI  Vitals:   08/06/24 1555 08/06/24 1703  BP: 135/88 122/71  Pulse: 98 84  Resp:    Temp:    SpO2:        Medical Decision Making & Provider Communication:  This RN has communicated with: Provider Name/Title: [facility]  Communicated with MAU provider SBAR report of labor evaluation. Also reviewed contraction pattern and that non-stress test is reactive.  Contraction Frequency (min): UI has been documented with no cervical change over 1 hours not indicating active labor.  Patient denies any other complaints.  Based on this report provider has given order for discharge. A discharge order and diagnosis entered by a provider. Labor discharge precautions reviewed with patient and all questions answered. Patient verbalized understanding.   Plan of Care: Discharge home with strict labor precautions

## 2024-08-06 NOTE — MAU Note (Signed)
 Robin Mendez is a 23 y.o. at [redacted]w[redacted]d here in MAU reporting: reports she's having vaginal bleeding, states was initially spotting but now it's light bleeding.  Denies recent intercourse.  Also states is having pelvic pressure with intermittent lower abdominal pain.  Denies LOF.  Reports +FM.    LMP: 10/12/2023 Onset of complaint: today Pain score: 3 Vitals:   08/06/24 1538  BP: 137/86  Pulse: 93  Resp: 19  Temp: 98.2 F (36.8 C)  SpO2: 100%     FHT: deferred secondary maternal apparel, will apply EFM once in room after triage completed   Lab orders placed from triage: UA

## 2024-08-06 NOTE — MAU Provider Note (Signed)
 Robin Mendez is a 23 y.o. G2P1001 female at [redacted]w[redacted]d  RN Labor check, not seen by provider SVE by RN: Dilation: 3 Effacement (%): 50 Station: -3 Exam by:: Nat Mule, RN \ NST: FHR baseline 140 bpm, Variability: moderate, Accelerations:present, Decelerations:  Absent= Reactive Toco: irregular, every 20 minutes  D/C home  Barkley Angles, MD OB Fellow, Faculty Practice [facility], Center for [employer]

## 2024-08-09 ENCOUNTER — Other Ambulatory Visit: Payer: Self-pay

## 2024-08-09 ENCOUNTER — Inpatient Hospital Stay (HOSPITAL_COMMUNITY)
Admission: AD | Admit: 2024-08-09 | Discharge: 2024-08-11 | DRG: 798 | Disposition: A | Discharge status: Home / Self Care | Attending: Obstetrics & Gynecology | Admitting: Obstetrics & Gynecology

## 2024-08-09 DIAGNOSIS — Z302 Encounter for sterilization: Secondary | ICD-10-CM | POA: Diagnosis not present

## 2024-08-09 DIAGNOSIS — Z833 Family history of diabetes mellitus: Secondary | ICD-10-CM

## 2024-08-09 DIAGNOSIS — O26893 Other specified pregnancy related conditions, third trimester: Secondary | ICD-10-CM | POA: Diagnosis present

## 2024-08-09 DIAGNOSIS — Z3A39 39 weeks gestation of pregnancy: Secondary | ICD-10-CM

## 2024-08-09 DIAGNOSIS — O99824 Streptococcus B carrier state complicating childbirth: Secondary | ICD-10-CM | POA: Diagnosis present

## 2024-08-09 DIAGNOSIS — O99820 Streptococcus B carrier state complicating pregnancy: Secondary | ICD-10-CM | POA: Diagnosis not present

## 2024-08-09 LAB — CBC
HCT: 38.9 % (ref 36.0–46.0)
Hemoglobin: 13.2 g/dL (ref 12.0–15.0)
MCH: 31.2 pg (ref 26.0–34.0)
MCHC: 33.9 g/dL (ref 30.0–36.0)
MCV: 92 fL (ref 80.0–100.0)
Platelets: 318 K/uL (ref 150–400)
RBC: 4.23 MIL/uL (ref 3.87–5.11)
RDW: 15.6 % — ABNORMAL HIGH (ref 11.5–15.5)
WBC: 11.9 K/uL — ABNORMAL HIGH (ref 4.0–10.5)
nRBC: 0 % (ref 0.0–0.2)

## 2024-08-09 LAB — TYPE AND SCREEN
ABO/RH(D): O POS
Antibody Screen: NEGATIVE

## 2024-08-09 MED ORDER — TETANUS-DIPHTH-ACELL PERTUSSIS 5-2-15.5 LF-MCG/0.5 IM SUSP
0.5000 mL | Freq: Once | INTRAMUSCULAR | Status: AC
  Administered 2024-08-11: 0.5 mL via INTRAMUSCULAR

## 2024-08-09 MED ORDER — FENTANYL CITRATE (PF) 100 MCG/2ML IJ SOLN
100.0000 ug | Freq: Once | INTRAMUSCULAR | Status: AC
  Administered 2024-08-09: 100 ug via INTRAVENOUS

## 2024-08-09 MED ORDER — COCONUT OIL OIL: 1.0000 | TOPICAL_OIL | Status: DC | PRN

## 2024-08-09 MED ORDER — BENZOCAINE-MENTHOL 20-0.5 % EX AERO
1.0000 | INHALATION_SPRAY | CUTANEOUS | Status: DC | PRN
  Administered 2024-08-09: 1 via TOPICAL
  Filled 2024-08-09: qty 56

## 2024-08-09 MED ORDER — OXYTOCIN-SODIUM CHLORIDE 30-0.9 UT/500ML-% IV SOLN
2.5000 [IU]/h | INTRAVENOUS | Status: DC
  Administered 2024-08-09: 2.5 [IU]/h via INTRAVENOUS
  Filled 2024-08-09: qty 500

## 2024-08-09 MED ORDER — SOD CITRATE-CITRIC ACID 500-334 MG/5ML PO SOLN: 30.0000 mL | ORAL | Status: DC | PRN

## 2024-08-09 MED ORDER — SODIUM CHLORIDE 0.9 % IV SOLN
2.0000 g | Freq: Once | INTRAVENOUS | Status: AC
  Administered 2024-08-09: 2 g via INTRAVENOUS
  Filled 2024-08-09: qty 2000

## 2024-08-09 MED ORDER — IBUPROFEN 600 MG PO TABS
600.0000 mg | ORAL_TABLET | Freq: Four times a day (QID) | ORAL | Status: DC
  Administered 2024-08-09 – 2024-08-11 (×8): 600 mg via ORAL
  Filled 2024-08-09 (×2): qty 1

## 2024-08-09 MED ORDER — METHYLERGONOVINE MALEATE 0.2 MG PO TABS
0.2000 mg | ORAL_TABLET | ORAL | Status: DC
  Administered 2024-08-09 – 2024-08-11 (×11): 0.2 mg via ORAL
  Filled 2024-08-09 (×3): qty 1

## 2024-08-09 MED ORDER — OXYCODONE HCL 5 MG PO TABS: 10.0000 mg | ORAL_TABLET | ORAL | Status: DC | PRN

## 2024-08-09 MED ORDER — OXYCODONE-ACETAMINOPHEN 5-325 MG PO TABS: 1.0000 | ORAL_TABLET | ORAL | Status: DC | PRN

## 2024-08-09 MED ORDER — ONDANSETRON HCL 4 MG PO TABS: 4.0000 mg | ORAL_TABLET | ORAL | Status: DC | PRN

## 2024-08-09 MED ORDER — OXYCODONE-ACETAMINOPHEN 5-325 MG PO TABS: 2.0000 | ORAL_TABLET | ORAL | Status: DC | PRN

## 2024-08-09 MED ORDER — FLEET ENEMA RE ENEM: 1.0000 | ENEMA | RECTAL | Status: DC | PRN

## 2024-08-09 MED ORDER — WITCH HAZEL-GLYCERIN EX PADS: 1.0000 | MEDICATED_PAD | CUTANEOUS | Status: DC | PRN

## 2024-08-09 MED ORDER — PRENATAL MULTIVITAMIN CH
1.0000 | ORAL_TABLET | Freq: Every day | ORAL | Status: DC
  Administered 2024-08-09 – 2024-08-11 (×3): 1 via ORAL
  Filled 2024-08-09: qty 1

## 2024-08-09 MED ORDER — LACTATED RINGERS IV SOLN: INTRAVENOUS | Status: DC

## 2024-08-09 MED ORDER — LIDOCAINE HCL (PF) 1 % IJ SOLN: 30.0000 mL | INTRAMUSCULAR | Status: DC | PRN

## 2024-08-09 MED ORDER — LACTATED RINGERS IV SOLN: 500.0000 mL | INTRAVENOUS | Status: DC | PRN

## 2024-08-09 MED ORDER — ACETAMINOPHEN 325 MG PO TABS
650.0000 mg | ORAL_TABLET | ORAL | Status: DC | PRN
  Administered 2024-08-09: 650 mg via ORAL
  Filled 2024-08-09: qty 2

## 2024-08-09 MED ORDER — DIPHENHYDRAMINE HCL 25 MG PO CAPS: 25.0000 mg | ORAL_CAPSULE | Freq: Four times a day (QID) | ORAL | Status: DC | PRN

## 2024-08-09 MED ORDER — SODIUM CHLORIDE 0.9 % IV SOLN: 1.0000 g | INTRAVENOUS | Status: DC

## 2024-08-09 MED ORDER — OXYCODONE HCL 5 MG PO TABS: 5.0000 mg | ORAL_TABLET | ORAL | Status: DC | PRN

## 2024-08-09 MED ORDER — FENTANYL CITRATE (PF) 100 MCG/2ML IJ SOLN
INTRAMUSCULAR | Status: AC
  Filled 2024-08-09: qty 2

## 2024-08-09 MED ORDER — MAGNESIUM HYDROXIDE 400 MG/5ML PO SUSP: 30.0000 mL | ORAL | Status: DC | PRN

## 2024-08-09 MED ORDER — ONDANSETRON HCL 4 MG/2ML IJ SOLN: 4.0000 mg | Freq: Four times a day (QID) | INTRAMUSCULAR | Status: DC | PRN

## 2024-08-09 MED ORDER — SODIUM CHLORIDE 0.9 % IV SOLN
3.0000 g | Freq: Once | INTRAVENOUS | Status: AC
  Administered 2024-08-09: 3 g via INTRAVENOUS
  Filled 2024-08-09 (×2): qty 8

## 2024-08-09 MED ORDER — MEASLES, MUMPS & RUBELLA VAC ~~LOC~~ SUSR: 0.5000 mL | Freq: Once | SUBCUTANEOUS | Status: DC

## 2024-08-09 MED ORDER — ACETAMINOPHEN 325 MG PO TABS: 650.0000 mg | ORAL_TABLET | ORAL | Status: DC | PRN

## 2024-08-09 MED ORDER — ONDANSETRON HCL 4 MG/2ML IJ SOLN: 4.0000 mg | INTRAMUSCULAR | Status: DC | PRN

## 2024-08-09 MED ORDER — OXYTOCIN BOLUS FROM INFUSION
333.0000 mL | Freq: Once | INTRAVENOUS | Status: AC
  Administered 2024-08-09: 333 mL via INTRAVENOUS

## 2024-08-09 MED ORDER — SIMETHICONE 80 MG PO CHEW: 80.0000 mg | CHEWABLE_TABLET | ORAL | Status: DC | PRN

## 2024-08-09 MED ORDER — DIBUCAINE (PERIANAL) 1 % EX OINT: 1.0000 | TOPICAL_OINTMENT | CUTANEOUS | Status: DC | PRN

## 2024-08-09 NOTE — H&P (Signed)
 HPI: Robin Mendez is a 23 y.o. year old G20P1001 female at [redacted]w[redacted]d weeks gestation by 9-week ultrasound who presents to MAU reporting Labor 8 cm dilated.  Having involuntary urge to push on arrival to labor and delivery.  Denies leaking of fluid, vaginal bleeding.  Normal fetal movement.  Uncomplicated pregnancy.  Received prenatal care at [facility] department.  OB History     Gravida  2   Para  1   Term  1   Preterm      AB      Living  1      SAB      IAB      Ectopic      Multiple  0   Live Births  1          Past Medical History:  Diagnosis Date   Chest pain    Encounter for induction of labor 08/16/2020   Vaginal delivery 08/17/2020   No past surgical history on file. Family History: family history includes Diabetes in her father. Social History:  reports that she has never smoked. She has never used smokeless tobacco. She reports that she does not currently use alcohol. She reports that she does not use drugs.     Maternal Diabetes: No Genetic Screening: Normal Maternal Ultrasounds/Referrals: Normal Fetal Ultrasounds or other Referrals:  None Maternal Substance Abuse:  No Significant Maternal Medications:  None Significant Maternal Lab Results:  Group B Strep positive Number of Prenatal Visits:greater than 3 verified prenatal visits Maternal Vaccinations:Flu Other Comments:  None  Review of Systems  Constitutional:  Negative for chills and fever.  Eyes:  Negative for visual disturbance.  Gastrointestinal:  Positive for abdominal pain. Negative for nausea and vomiting.  Genitourinary:  Negative for vaginal bleeding and vaginal discharge.  Neurological:  Negative for headaches.   Maternal Medical History:  Reason for admission: Nausea.    Dilation: Lip/rim Effacement (%): 80 Station: 0 Exam by:: CNM Blood pressure 123/74, pulse 91, temperature 98.1 F (36.7 C), temperature source Oral, resp. rate 18, last menstrual period  10/12/2023, SpO2 99%, unknown if currently breastfeeding. Maternal Exam:  Introitus: Vagina is negative for discharge.    Physical Exam Constitutional:      General: She is in acute distress.     Appearance: She is well-developed.  HENT:     Head: Normocephalic.  Eyes:     Conjunctiva/sclera: Conjunctivae normal.  Cardiovascular:     Rate and Rhythm: Normal rate and regular rhythm.     Heart sounds: Normal heart sounds.  Pulmonary:     Effort: Pulmonary effort is normal. No respiratory distress.     Breath sounds: Normal breath sounds.  Abdominal:     Palpations: Abdomen is soft.     Tenderness: There is no abdominal tenderness.  Genitourinary:    Vagina: No vaginal discharge or bleeding.  Musculoskeletal:        General: Normal range of motion.     Cervical back: Normal range of motion and neck supple.     Right lower leg: No edema.     Left lower leg: No edema.  Skin:    General: Skin is warm and dry.  Neurological:     Mental Status: She is alert and oriented to person, place, and time.  Psychiatric:        Mood and Affect: Mood normal.     Prenatal labs: ABO, Rh: --/--/O POS (12/28 1037) Antibody: NEG (12/28 1037) Rubella: Immune (05/29 0000)  RPR:   Neg HBsAg:   Neg HIV: Non-reactive (10/20 0000)  GBS: Positive/-- (12/11 0000)   Assessment: 1. Labor: Transition 2. Fetal Wellbeing: Category 1 3. Pain Control: Comfort measures 4. GBS: Positive 5.  39.3 week IUP  Plan:  1. Admit to BS per consult with MD 2. Routine L&D orders 3. Analgesia/anesthesia PRN  4.  Ampicillin  for GBS prophylaxis.  Inadequate prophylaxis.  Baby born en caul.  5.  Confirm that she would still like to have bilateral tubal sterilization.  Has private insurance.  Reiterated that she should consider procedure permanent.    Kaya Klausing  Claudene 08/09/2024, 8:59 PM

## 2024-08-09 NOTE — Discharge Summary (Signed)
 "    Postpartum Discharge Summary  Date of Service updated***     Patient Name: Robin Mendez DOB: April 26, 2001 MRN: [id number]  Date of admission: 08/09/2024 Delivery date:08/09/2024 Delivering provider: CLAUDENE, Real Cona  Date of discharge: 08/09/2024  Admitting diagnosis: Normal labor [O80, Z37.9] Intrauterine pregnancy: [redacted]w[redacted]d     Secondary diagnosis:  Principal Problem:   Normal labor Active Problems:   Vaginal delivery   Encounter for sterilization  Additional problems: ***    Discharge diagnosis: Term Pregnancy Delivered                                              Post partum procedures:{Postpartum procedures:23558} Augmentation: None Complications: None  Hospital course: Onset of Labor With Vaginal Delivery      23 y.o. yo G2P1001 at [redacted]w[redacted]d was admitted in Active Labor on 08/09/2024. Labor course was complicated by nothing Membrane Rupture Time/Date:  ,   Delivery Method:Vaginal, Spontaneous Operative Delivery:N/A Episiotomy: None Lacerations:  None Patient had a postpartum course complicated by ***.  She is ambulating, tolerating a regular diet, passing flatus, and urinating well. Patient is discharged home in stable condition on 08/09/2024.  Newborn Data: Birth date:08/09/2024 Birth time:11:06 AM Gender:Female Living status:Living Apgars:8 ,9  Weight:3000 g  Magnesium  Sulfate received: No BMZ received: No Rhophylac:N/A MMR:N/A T-DaP:Unknown Flu: Yes RSV Vaccine received: No Transfusion:{Transfusion received:30440034}  Immunizations received: Immunization History  Administered Date(s) Administered   Influenza, Seasonal, Injecte, Preservative Fre 06/11/2024    Physical exam  Vitals:   08/09/24 1230 08/09/24 1325 08/09/24 1430 08/09/24 1752  BP: 113/69 124/79 109/74 123/74  Pulse: (!) 58 64 93 91  Resp:  17 18 18   Temp:  98.6 F (37 C) 98 F (36.7 C) 98.1 F (36.7 C)  TempSrc:  Oral Oral Oral  SpO2:    99%   General: {Exam;  general:21111117} Lochia: {Desc; appropriate/inappropriate:30686::appropriate} Uterine Fundus: {Desc; firm/soft:30687} Incision: {Exam; incision:21111123} DVT Evaluation: {Exam; dvt:2111122} Labs: Lab Results  Component Value Date   WBC 11.9 (H) 08/09/2024   HGB 13.2 08/09/2024   HCT 38.9 08/09/2024   MCV 92.0 08/09/2024   PLT 318 08/09/2024      Latest Ref Rng & Units 06/20/2023    9:54 AM  CMP  Glucose 70 - 99 mg/dL 897   BUN 6 - 20 mg/dL 11   Creatinine 9.55 - 1.00 mg/dL 9.45   Sodium 864 - 854 mmol/L 137   Potassium 3.5 - 5.1 mmol/L 3.8   Chloride 98 - 111 mmol/L 107   CO2 22 - 32 mmol/L 24   Calcium 8.9 - 10.3 mg/dL 9.4    Edinburgh Score:    08/09/2024    3:15 PM  Edinburgh Postnatal Depression Scale Screening Tool  I have been able to laugh and see the funny side of things. 0  I have looked forward with enjoyment to things. 0  I have blamed myself unnecessarily when things went wrong. 1  I have been anxious or worried for no good reason. 0  I have felt scared or panicky for no good reason. 0  Things have been getting on top of me. 0  I have been so unhappy that I have had difficulty sleeping. 0  I have felt sad or miserable. 0  I have been so unhappy that I have been crying. 0  The thought of harming myself has occurred to  me. 0  Edinburgh Postnatal Depression Scale Total 1   Edinburgh Postnatal Depression Scale Total: 1   After visit meds:  Allergies as of 08/09/2024   No Known Allergies   Med Rec must be completed prior to using this [facility]***        Discharge home in stable condition Infant Feeding: Breast Infant Disposition:{CHL IP OB HOME WITH FNUYZM:76418} Discharge instruction: per After Visit Summary and Postpartum booklet. Activity: Advance as tolerated. Pelvic rest for 6 weeks.  Diet: {OB ipzu:[id number]} Future Appointments: Future Appointments  Date Time Provider Department Center  10/07/2024  3:15 PM Alm Delon SAILOR, DO  [facility] None   Follow up Visit:   Please schedule this patient for a In person postpartum visit in 4 weeks with the following provider: Any provider. Additional Postpartum F/U: Incision check 1 week Low risk pregnancy complicated by: Nothing Delivery mode:  Vaginal, Spontaneous Anticipated Birth Control:  BTL done PP   08/09/2024 Macy Polio  Claudene, CNM    "

## 2024-08-09 NOTE — MAU Note (Signed)
 Robin Mendez is a 23 y.o. at [redacted]w[redacted]d here in MAU reporting: regular contractions that began around 3am. Denies vaginal bleeding or leaking of fluid; normal fetal movement reported. Receives care at the [facility]. No complications this pregnancy other than GBS+.  LMP: 10/12/23 Onset of complaint: 3am on 08/09/24 Pain score: 10/10 with contractions There were no vitals filed for this visit.   FHT: 150bpm

## 2024-08-10 ENCOUNTER — Inpatient Hospital Stay (HOSPITAL_COMMUNITY): Admitting: Anesthesiology

## 2024-08-10 ENCOUNTER — Encounter (HOSPITAL_COMMUNITY): Payer: Self-pay | Admitting: Obstetrics & Gynecology

## 2024-08-10 ENCOUNTER — Encounter: Payer: Self-pay | Admitting: Lactation Services

## 2024-08-10 ENCOUNTER — Encounter (HOSPITAL_COMMUNITY)
Admission: AD | Disposition: A | Discharge status: Home / Self Care | Payer: Self-pay | Source: Home / Self Care | Attending: Obstetrics & Gynecology

## 2024-08-10 DIAGNOSIS — Z302 Encounter for sterilization: Secondary | ICD-10-CM

## 2024-08-10 HISTORY — PX: TUBAL LIGATION: SHX77

## 2024-08-10 LAB — CBC
HCT: 34.4 % — ABNORMAL LOW (ref 36.0–46.0)
Hemoglobin: 11.6 g/dL — ABNORMAL LOW (ref 12.0–15.0)
MCH: 31.2 pg (ref 26.0–34.0)
MCHC: 33.7 g/dL (ref 30.0–36.0)
MCV: 92.5 fL (ref 80.0–100.0)
Platelets: 278 K/uL (ref 150–400)
RBC: 3.72 MIL/uL — ABNORMAL LOW (ref 3.87–5.11)
RDW: 15.5 % (ref 11.5–15.5)
WBC: 11.9 K/uL — ABNORMAL HIGH (ref 4.0–10.5)
nRBC: 0 % (ref 0.0–0.2)

## 2024-08-10 LAB — SYPHILIS: RPR W/REFLEX TO RPR TITER AND TREPONEMAL ANTIBODIES, TRADITIONAL SCREENING AND DIAGNOSIS ALGORITHM: RPR Ser Ql: NONREACTIVE

## 2024-08-10 SURGERY — LIGATION, FALLOPIAN TUBE, POSTPARTUM
Anesthesia: General

## 2024-08-10 MED ORDER — KETOROLAC TROMETHAMINE 30 MG/ML IJ SOLN
INTRAMUSCULAR | Status: AC
  Filled 2024-08-10: qty 1

## 2024-08-10 MED ORDER — FAMOTIDINE 20 MG PO TABS
40.0000 mg | ORAL_TABLET | Freq: Once | ORAL | Status: AC
  Administered 2024-08-10: 40 mg via ORAL
  Filled 2024-08-10: qty 2

## 2024-08-10 MED ORDER — EPHEDRINE SULFATE-NACL 50-0.9 MG/10ML-% IV SOSY
PREFILLED_SYRINGE | INTRAVENOUS | Status: DC | PRN
  Administered 2024-08-10 (×2): 5 mg via INTRAVENOUS

## 2024-08-10 MED ORDER — MEPERIDINE HCL 25 MG/ML IJ SOLN: 6.2500 mg | INTRAMUSCULAR | Status: DC | PRN

## 2024-08-10 MED ORDER — LIDOCAINE HCL (PF) 2 % IJ SOLN
INTRAMUSCULAR | Status: DC | PRN
  Administered 2024-08-10: 100 mg via INTRADERMAL

## 2024-08-10 MED ORDER — BUPIVACAINE HCL (PF) 0.25 % IJ SOLN
INTRAMUSCULAR | Status: DC | PRN
  Administered 2024-08-10: 20 mL

## 2024-08-10 MED ORDER — LACTATED RINGERS IV SOLN: INTRAVENOUS | Status: AC

## 2024-08-10 MED ORDER — ONDANSETRON HCL 4 MG/2ML IJ SOLN
INTRAMUSCULAR | Status: DC | PRN
  Administered 2024-08-10: 4 mg via INTRAVENOUS

## 2024-08-10 MED ORDER — SUCCINYLCHOLINE CHLORIDE 200 MG/10ML IV SOSY
PREFILLED_SYRINGE | INTRAVENOUS | Status: DC | PRN
  Administered 2024-08-10: 140 mg via INTRAVENOUS

## 2024-08-10 MED ORDER — KETOROLAC TROMETHAMINE 30 MG/ML IJ SOLN
INTRAMUSCULAR | Status: DC | PRN
  Administered 2024-08-10: 30 mg via INTRAVENOUS

## 2024-08-10 MED ORDER — OXYCODONE HCL 5 MG/5ML PO SOLN: 5.0000 mg | Freq: Once | ORAL | Status: DC | PRN

## 2024-08-10 MED ORDER — ONDANSETRON HCL 4 MG/2ML IJ SOLN: 4.0000 mg | Freq: Once | INTRAMUSCULAR | Status: DC | PRN

## 2024-08-10 MED ORDER — BUPIVACAINE HCL (PF) 0.25 % IJ SOLN
INTRAMUSCULAR | Status: AC
  Filled 2024-08-10: qty 20

## 2024-08-10 MED ORDER — DEXAMETHASONE SOD PHOSPHATE PF 10 MG/ML IJ SOLN
INTRAMUSCULAR | Status: DC | PRN
  Administered 2024-08-10: 10 mg via INTRAVENOUS

## 2024-08-10 MED ORDER — MIDAZOLAM HCL 2 MG/2ML IJ SOLN
INTRAMUSCULAR | Status: AC
  Filled 2024-08-10: qty 2

## 2024-08-10 MED ORDER — PROPOFOL 10 MG/ML IV BOLUS
INTRAVENOUS | Status: DC | PRN
  Administered 2024-08-10: 160 mg via INTRAVENOUS

## 2024-08-10 MED ORDER — OXYCODONE HCL 5 MG PO TABS: 5.0000 mg | ORAL_TABLET | Freq: Once | ORAL | Status: DC | PRN

## 2024-08-10 MED ORDER — FENTANYL CITRATE (PF) 100 MCG/2ML IJ SOLN: 25.0000 ug | INTRAMUSCULAR | Status: DC | PRN

## 2024-08-10 MED ORDER — FENTANYL CITRATE (PF) 100 MCG/2ML IJ SOLN
INTRAMUSCULAR | Status: AC
  Filled 2024-08-10: qty 2

## 2024-08-10 MED ORDER — LACTATED RINGERS IV SOLN: INTRAVENOUS | Status: DC | PRN

## 2024-08-10 MED ORDER — MIDAZOLAM HCL (PF) 2 MG/2ML IJ SOLN
INTRAMUSCULAR | Status: DC | PRN
  Administered 2024-08-10: 2 mg via INTRAVENOUS

## 2024-08-10 MED ORDER — ONDANSETRON HCL 4 MG/2ML IJ SOLN
INTRAMUSCULAR | Status: AC
  Filled 2024-08-10: qty 4

## 2024-08-10 MED ORDER — LIDOCAINE 2% (20 MG/ML) 5 ML SYRINGE
INTRAMUSCULAR | Status: AC
  Filled 2024-08-10: qty 20

## 2024-08-10 MED ORDER — FENTANYL CITRATE (PF) 250 MCG/5ML IJ SOLN
INTRAMUSCULAR | Status: DC | PRN
  Administered 2024-08-10: 25 ug via INTRAVENOUS
  Administered 2024-08-10 (×2): 50 ug via INTRAVENOUS

## 2024-08-10 MED ORDER — METOCLOPRAMIDE HCL 10 MG PO TABS
10.0000 mg | ORAL_TABLET | Freq: Once | ORAL | Status: AC
  Administered 2024-08-10: 10 mg via ORAL
  Filled 2024-08-10: qty 1

## 2024-08-10 MED ORDER — PHENYLEPHRINE 80 MCG/ML (10ML) SYRINGE FOR IV PUSH (FOR BLOOD PRESSURE SUPPORT)
PREFILLED_SYRINGE | INTRAVENOUS | Status: DC | PRN
  Administered 2024-08-10 (×2): 160 ug via INTRAVENOUS

## 2024-08-10 MED ORDER — SUCCINYLCHOLINE CHLORIDE 200 MG/10ML IV SOSY
PREFILLED_SYRINGE | INTRAVENOUS | Status: AC
  Filled 2024-08-10: qty 10

## 2024-08-10 SURGICAL SUPPLY — 25 items
CLOTH BEACON ORANGE TIMEOUT ST (SAFETY) ×1 IMPLANT
DERMABOND ADVANCED .7 DNX12 (GAUZE/BANDAGES/DRESSINGS) ×1 IMPLANT
DERMABOND ADVANCED .7 DNX6 (GAUZE/BANDAGES/DRESSINGS) IMPLANT
DISSECTOR SURG LIGASURE 21 (MISCELLANEOUS) IMPLANT
DRSG OPSITE POSTOP 3X4 (GAUZE/BANDAGES/DRESSINGS) ×1 IMPLANT
ELECTRODE REM PT RTRN 9FT ADLT (ELECTROSURGICAL) ×1 IMPLANT
GLOVE BIOGEL PI IND STRL 6.5 (GLOVE) ×1 IMPLANT
GLOVE BIOGEL PI IND STRL 7.0 (GLOVE) ×1 IMPLANT
GLOVE ECLIPSE 6.5 STRL STRAW (GLOVE) ×1 IMPLANT
GOWN STRL REUS W/TWL LRG LVL3 (GOWN DISPOSABLE) ×2 IMPLANT
NEEDLE HYPO 22GX1.5 SAFETY (NEEDLE) ×1 IMPLANT
NS IRRIG 1000ML POUR BTL (IV SOLUTION) ×1 IMPLANT
PACK ABDOMINAL MINOR (CUSTOM PROCEDURE TRAY) ×1 IMPLANT
PENCIL BUTTON HOLSTER BLD 10FT (ELECTRODE) ×1 IMPLANT
PROTECTOR NERVE ULNAR (MISCELLANEOUS) ×1 IMPLANT
SPONGE LAP 4X18 RFD (DISPOSABLE) IMPLANT
SUT PLAIN 0 NONE (SUTURE) ×1 IMPLANT
SUT VIC AB 0 CT1 27XBRD ANBCTR (SUTURE) ×1 IMPLANT
SUT VICRYL 4-0 PS2 18IN ABS (SUTURE) ×1 IMPLANT
SYR CONTROL 10ML LL (SYRINGE) ×1 IMPLANT
TOWEL OR 17X24 6PK STRL BLUE (TOWEL DISPOSABLE) ×2 IMPLANT
TRAY FOLEY CATH SILVER 14FR (SET/KITS/TRAYS/PACK) ×1 IMPLANT
TUBING NON-CON 1/4 X 20 CONN (TUBING) ×1 IMPLANT
WATER STERILE IRR 1000ML POUR (IV SOLUTION) ×1 IMPLANT
YANKAUER SUCT BULB TIP NO VENT (SUCTIONS) ×1 IMPLANT

## 2024-08-10 NOTE — Anesthesia Procedure Notes (Signed)
 Procedure Name: Intubation Date/Time: 08/10/2024 11:29 AM  Performed by: Claudene Hoy CROME, CRNAPre-anesthesia Checklist: Patient identified, Emergency Drugs available, Suction available and Patient being monitored Patient Re-evaluated:Patient Re-evaluated prior to induction Oxygen Delivery Method: Circle System Utilized Preoxygenation: Pre-oxygenation with 100% oxygen Induction Type: IV induction, Rapid sequence and Cricoid Pressure applied Laryngoscope Size: Mac and 3 Grade View: Grade I Tube type: Oral Tube size: 7.0 mm Number of attempts: 1 Airway Equipment and Method: Stylet Placement Confirmation: ETT inserted through vocal cords under direct vision, positive ETCO2 and breath sounds checked- equal and bilateral Secured at: 21 cm Tube secured with: Tape Dental Injury: Teeth and Oropharynx as per pre-operative assessment

## 2024-08-10 NOTE — Anesthesia Postprocedure Evaluation (Signed)
"   Anesthesia Post Note  Patient: Robin Mendez  Procedure(s) Performed: LIGATION, FALLOPIAN TUBE, POSTPARTUM     Patient location during evaluation: PACU Anesthesia Type: General Level of consciousness: awake and alert Pain management: pain level controlled Vital Signs Assessment: post-procedure vital signs reviewed and stable Respiratory status: spontaneous breathing, nonlabored ventilation, respiratory function stable and patient connected to nasal cannula oxygen Cardiovascular status: blood pressure returned to baseline and stable Postop Assessment: no apparent nausea or vomiting Anesthetic complications: no   No notable events documented.  Last Vitals:  Vitals:   08/10/24 1342 08/10/24 1521  BP: 119/70 112/68  Pulse: 90 99  Resp: 16 18  Temp: 36.7 C 36.8 C  SpO2: 98% 95%    Last Pain:  Vitals:   08/10/24 1521  TempSrc: Oral  PainSc: 0-No pain   Pain Goal:                   Kynnedy Carreno      "

## 2024-08-10 NOTE — Op Note (Signed)
 Robin Mendez 08/10/2024  PREOPERATIVE DIAGNOSIS:  Undesired fertility  POSTOPERATIVE DIAGNOSIS:  Undesired fertility  PROCEDURE:  Postpartum Bilateral Tubal Sterilization using Ligasure   SURGEON:  Dr Lang Peel  ANESTHESIA:  Epidural  COMPLICATIONS:  None immediate.  ESTIMATED BLOOD LOSS:  Less than 20cc.  FLUIDS: 400 mL LR.  URINE OUTPUT:  56 mL of clear urine.  INDICATIONS: 23 y.o. yo G2P2002  with undesired fertility,status post vaginal delivery, desires permanent sterilization. Risks and benefits of procedure discussed with patient including permanence of method, bleeding, infection, injury to surrounding organs and need for additional procedures. Risk failure of 0.5-1% with increased risk of ectopic gestation if pregnancy occurs was also discussed with patient.   FINDINGS:  Normal uterus, tubes, and ovaries.  TECHNIQUE:  The patient was taken to the operating room where her epidural anesthesia was dosed up to surgical level and found to be adequate.  She was then placed in the dorsal supine position and prepped and draped in sterile fashion.  After an adequate timeout was performed, attention was turned to the patient's abdomen where a small transverse skin incision was made under the umbilical fold. The incision was taken down to the layer of fascia using the scalpel, and fascia was incised, and extended bilaterally using Mayo scissors. The peritoneum was entered in a sharp fashion.   Attention was then turned to the patient's adnexa. The left fallopian tube was identified and followed out to the fimbriated end.  Ligasure device was used to cauterize and cut the mesosalpinx to proximal end of the fallopian tube, removing 6cm of tube. A similar process was carried out on the right side allowing for bilateral tubal sterilization.    Good hemostasis was noted overall.  Local analgesia was drizzled on both operative sites.The instruments were then removed from the patient's abdomen  and the fascial incision was repaired with 0 Vicryl, and the skin was closed with a 3-0 Monocryl subcuticular stitch. The patient tolerated the procedure well.  Sponge, lap, and needle counts were correct times two.  The patient was then taken to the recovery room awake, extubated and in stable condition.   Albion Weatherholtz J, DO 08/10/2024 2:36 PM

## 2024-08-10 NOTE — Lactation Note (Signed)
 This note was copied from a baby's chart. Lactation Consultation Note  Patient Name: Robin Mendez Date: 08/10/2024 Age:24 hours   Attempted to see mom but she was sleeping.  Maternal Data    Feeding    LATCH Score Latch: Grasps breast easily, tongue down, lips flanged, rhythmical sucking.  Audible Swallowing: A few with stimulation  Type of Nipple: Everted at rest and after stimulation  Comfort (Breast/Nipple): Soft / non-tender  Hold (Positioning): No assistance needed to correctly position infant at breast.  LATCH Score: 9   Lactation Tools Discussed/Used    Interventions    Discharge    Consult Status      Graydon Fofana, LEITA MATSU 08/10/2024, 3:07 AM

## 2024-08-10 NOTE — Lactation Note (Signed)
 This note was copied from a baby's chart. Lactation Consultation Note  Patient Name: Robin Mendez Date: 08/10/2024 Age:24 hours Reason for consult: Initial assessment;Term  P2. Mom didn't BF her 1st child but is doing a good job BF this baby. Mom stated she was having some painful latches. Noted seeing baby's mouth and nipple. Strongly encouraged keeping cheeks to breast and not being about to see baby's mouth around nipple. Newborn feeding habits, STS, behavior, body alignment, props, support while feeding reviewed. Praised mom for great feeding. Noted milk transfer from breast. Praised mom. Mom encouraged to feed baby 8-12 times/24 hours and with feeding cues.  Taught lip flanging and chin tug. Mom stated sooo much better. Baby has a little mouth. Answered mom's questions. Encouraged to call for assistance or questions. Maternal Data Has patient been taught Hand Expression?: Yes Does the patient have breastfeeding experience prior to this delivery?: No  Feeding    LATCH Score Latch: Grasps breast easily, tongue down, lips flanged, rhythmical sucking.  Audible Swallowing: A few with stimulation  Type of Nipple: Everted at rest and after stimulation  Comfort (Breast/Nipple): Filling, red/small blisters or bruises, mild/mod discomfort (getting a little bit sore)  Hold (Positioning): Assistance needed to correctly position infant at breast and maintain latch.  LATCH Score: 7   Lactation Tools Discussed/Used    Interventions Interventions: Breast feeding basics reviewed;Assisted with latch;Skin to skin;Breast massage;Hand express;Breast compression;Adjust position;Support pillows;Position options;Education;LC Services brochure  Discharge Discharge Education: Outpatient recommendation Pump: DEBP;Hands Free (Spectra Bradly)  Consult Status Consult Status: Follow-up Date: 08/10/24 Follow-up type: In-patient    Robin Mendez 08/10/2024, 5:45 AM

## 2024-08-10 NOTE — Transfer of Care (Signed)
 Immediate Anesthesia Transfer of Care Note  Patient: Robin Mendez  Procedure(s) Performed: LIGATION, FALLOPIAN TUBE, POSTPARTUM  Patient Location: PACU  Anesthesia Type:General  Level of Consciousness: drowsy and patient cooperative  Airway & Oxygen Therapy: Patient Spontanous Breathing  Post-op Assessment: Report given to RN and Post -op Vital signs reviewed and stable  Post vital signs: Reviewed and stable  Last Vitals:  Vitals Value Taken Time  BP 136/83 08/10/24 12:35  Temp    Pulse 94 08/10/24 12:37  Resp 15 08/10/24 12:37  SpO2 95 % 08/10/24 12:37  Vitals shown include unfiled device data.  Last Pain:  Vitals:   08/10/24 1053  TempSrc: Oral  PainSc:          Complications: No notable events documented.

## 2024-08-10 NOTE — Lactation Note (Signed)
 This note was copied from a baby's chart. Lactation Consultation Note  Patient Name: Robin Mendez Unijb'd Date: 08/10/2024 Age:24 hours   Mom awake. Asked mom when was it time to feed baby mom stated in an hour. Asked mom if she was having any trouble feeding mom stated she was having trouble keeping him awake and it was hurting some. LC offered to turn down heat. Mom stated yes please that she was sweating. Heat was on 76. Asked mom if she would call for Lactation for next feeding mom stated OK.  Maternal Data    Feeding    LATCH Score                    Lactation Tools Discussed/Used    Interventions    Discharge    Consult Status      Jessamine Barcia G 08/10/2024, 4:44 AM

## 2024-08-10 NOTE — Anesthesia Preprocedure Evaluation (Addendum)
"                                    Anesthesia Evaluation  Patient identified by MRN, date of birth, ID band Patient awake    Reviewed: Allergy & Precautions, H&P , NPO status , Patient's Chart, lab work & pertinent test results  Airway Mallampati: II  TM Distance: >3 FB Neck ROM: Full    Dental no notable dental hx. (+) Teeth Intact, Dental Advisory Given   Pulmonary neg pulmonary ROS   Pulmonary exam normal breath sounds clear to auscultation       Cardiovascular Exercise Tolerance: Good negative cardio ROS Normal cardiovascular exam Rhythm:Regular Rate:Normal     Neuro/Psych negative neurological ROS  negative psych ROS   GI/Hepatic negative GI ROS, Neg liver ROS,,,  Endo/Other  negative endocrine ROS    Renal/GU negative Renal ROS  negative genitourinary   Musculoskeletal negative musculoskeletal ROS (+)    Abdominal   Peds negative pediatric ROS (+)  Hematology negative hematology ROS (+)   Anesthesia Other Findings   Reproductive/Obstetrics negative OB ROS                              Anesthesia Physical Anesthesia Plan  ASA: 2  Anesthesia Plan: General   Post-op Pain Management: Minimal or no pain anticipated and Tylenol  PO (pre-op)*   Induction: Intravenous and Cricoid pressure planned  PONV Risk Score and Plan: 3 and Ondansetron  and Dexamethasone   Airway Management Planned: Oral ETT  Additional Equipment: None  Intra-op Plan:   Post-operative Plan: Extubation in OR  Informed Consent: I have reviewed the patients History and Physical, chart, labs and discussed the procedure including the risks, benefits and alternatives for the proposed anesthesia with the patient or authorized representative who has indicated his/her understanding and acceptance.       Plan Discussed with: Anesthesiologist and CRNA  Anesthesia Plan Comments: (  )         Anesthesia Quick Evaluation  "

## 2024-08-11 MED ORDER — IBUPROFEN 600 MG PO TABS: 600.0000 mg | ORAL_TABLET | Freq: Four times a day (QID) | ORAL | 0 refills | Status: AC

## 2024-08-11 MED ORDER — ACETAMINOPHEN 325 MG PO TABS: 650.0000 mg | ORAL_TABLET | ORAL | 0 refills | Status: AC | PRN

## 2024-08-11 MED ORDER — OXYCODONE HCL 5 MG PO TABS: 5.0000 mg | ORAL_TABLET | ORAL | 0 refills | Status: AC | PRN

## 2024-08-11 NOTE — Lactation Note (Signed)
 This note was copied from a baby's chart. Lactation Consultation Note  Patient Name: Boy Velda Wendt Unijb'd Date: 08/11/2024 Age:24 hours, P2  Reason for consult: Follow-up assessment;Term;Infant weight loss;Breastfeeding assistance When LC entered the room the baby was latched on the left breast, sluggishly feeding until the [facility] added a support pillow and the baby was closer. Increased swallows noted and LC showed mom how to do the breast compressions. Per mom more comfortable.  LC stressed the importance of firm support to start to ensure a deep latch.  LC noted full warm breast and reassured mom her milk is coming in and the increased swallows are encouraging.  Baby finished, took a short break and LC assisted to latch on the right breast in the football position, increased swallows noted right away and per mom comfortable.  LC reviewed breast feeding basics and breast feeding goals for 24 hours. Feed with cues and by 3 hours STS.  LC recommended to enhance the flow to pre- pumping with the hand pump to prime the milk ducts and to help the baby stay in a consistent pattern to enhance weight gain.  Per mom has a DEBP . LC recommended adding 4 post pumping after the feedings when the baby isn't cluster feeding. Safe the milk and can supplement back to the baby for an appetizer if sluggish to start or after breast feeding.  LC reviewed engorgement prevention and tx.  LC instructed mom on the use shells and hand pump - #18 F.  Mom aware there is a LC at the [facility] or the [facility]. If needed.   Maternal Data Has patient been taught Hand Expression?: Yes Does the patient have breastfeeding experience prior to this delivery?: Yes How long did the patient breastfeed?: per mom attempted  Feeding Mother's Current Feeding Choice: Breast Milk  LATCH Score Latch: Grasps breast easily, tongue down, lips flanged, rhythmical sucking.  Audible Swallowing: Spontaneous and  intermittent  Type of Nipple: Everted at rest and after stimulation  Comfort (Breast/Nipple): Filling, red/small blisters or bruises, mild/mod discomfort  Hold (Positioning): Assistance needed to correctly position infant at breast and maintain latch.  LATCH Score: 8   Lactation Tools Discussed/Used Tools: Shells;Pump;Flanges Flange Size: 18;21 Breast pump type: Manual Pump Education: Setup, frequency, and cleaning;Milk Storage Reason for Pumping: LC recommended prior to latch ion the 1st to prime the milk ducts to help the baby get into a consistent pattern  Interventions Interventions: Breast feeding basics reviewed;Assisted with latch;Skin to skin;Breast massage;Hand express;Pre-pump if needed;Reverse pressure;Breast compression;Adjust position;Support pillows;Position options;Shells;Hand pump;Education;LC Services brochure;CDC milk storage guidelines;CDC Guidelines for Breast Pump Cleaning  Discharge Discharge Education: Engorgement and breast care;Warning signs for feeding baby;Outpatient recommendation (if needed) Pump: Personal;Manual;DEBP;Hands Free  Consult Status Consult Status: Complete Date: 08/11/24    Rollene Jenkins Fiedler 08/11/2024, 11:31 AM

## 2024-08-13 ENCOUNTER — Inpatient Hospital Stay (HOSPITAL_COMMUNITY): Admit: 2024-08-13

## 2024-08-15 LAB — SURGICAL PATHOLOGY

## 2024-08-19 ENCOUNTER — Ambulatory Visit: Payer: Self-pay | Admitting: *Deleted

## 2024-08-19 ENCOUNTER — Other Ambulatory Visit: Payer: Self-pay

## 2024-08-19 VITALS — BP 118/78 | HR 91 | Ht 62.0 in | Wt 181.6 lb

## 2024-08-19 DIAGNOSIS — Z4889 Encounter for other specified surgical aftercare: Secondary | ICD-10-CM

## 2024-08-19 NOTE — Progress Notes (Signed)
 Here for incision check s/p BTL 08/10/24 after vaginal delivery 08/09/24. She reports no issues.  Incision CDI with large amount of glue. Reviewed wound care and when to call office. Reviewed signs of postpartum pre-eclampsia. Advised to call [facility] to schedule postpartum visit. She voices understanding. Rock Skip PEAK

## 2024-10-07 ENCOUNTER — Ambulatory Visit: Admitting: Dermatology
